# Patient Record
Sex: Male | Born: 1975 | Hispanic: Yes | Marital: Single | Smoking: Never smoker
Health system: Southern US, Community
[De-identification: ages and names within clinical notes are randomized; demographics above are authoritative.]

## PROBLEM LIST (undated history)

## (undated) DIAGNOSIS — Z789 Other specified health status: Secondary | ICD-10-CM

## (undated) HISTORY — DX: Other specified health status: Z78.9

## (undated) HISTORY — PX: NO PAST SURGERIES: SHX2092

---

## 2019-02-13 ENCOUNTER — Encounter (HOSPITAL_COMMUNITY): Payer: Self-pay | Admitting: Emergency Medicine

## 2019-02-13 ENCOUNTER — Inpatient Hospital Stay (HOSPITAL_COMMUNITY)
Admission: EM | Admit: 2019-02-13 | Discharge: 2019-02-19 | DRG: 177 | Disposition: A | Discharge status: Home / Self Care | Payer: HRSA Program | Attending: Internal Medicine | Admitting: Internal Medicine

## 2019-02-13 ENCOUNTER — Other Ambulatory Visit: Payer: Self-pay

## 2019-02-13 ENCOUNTER — Emergency Department (HOSPITAL_COMMUNITY): Payer: HRSA Program

## 2019-02-13 DIAGNOSIS — J1289 Other viral pneumonia: Secondary | ICD-10-CM | POA: Diagnosis present

## 2019-02-13 DIAGNOSIS — Z6832 Body mass index (BMI) 32.0-32.9, adult: Secondary | ICD-10-CM

## 2019-02-13 DIAGNOSIS — E871 Hypo-osmolality and hyponatremia: Secondary | ICD-10-CM | POA: Diagnosis present

## 2019-02-13 DIAGNOSIS — U071 COVID-19: Secondary | ICD-10-CM | POA: Diagnosis present

## 2019-02-13 DIAGNOSIS — E669 Obesity, unspecified: Secondary | ICD-10-CM | POA: Diagnosis present

## 2019-02-13 DIAGNOSIS — R001 Bradycardia, unspecified: Secondary | ICD-10-CM | POA: Diagnosis present

## 2019-02-13 DIAGNOSIS — E861 Hypovolemia: Secondary | ICD-10-CM | POA: Diagnosis present

## 2019-02-13 DIAGNOSIS — J9601 Acute respiratory failure with hypoxia: Secondary | ICD-10-CM | POA: Diagnosis present

## 2019-02-13 LAB — SARS CORONAVIRUS 2 BY RT PCR (HOSPITAL ORDER, PERFORMED IN ~~LOC~~ HOSPITAL LAB): SARS Coronavirus 2: POSITIVE — AB

## 2019-02-13 LAB — CBC WITH DIFFERENTIAL/PLATELET
Abs Immature Granulocytes: 0.03 10*3/uL (ref 0.00–0.07)
Basophils Absolute: 0 10*3/uL (ref 0.0–0.1)
Basophils Relative: 0 %
Eosinophils Absolute: 0.1 10*3/uL (ref 0.0–0.5)
Eosinophils Relative: 1 %
HCT: 40.8 % (ref 39.0–52.0)
Hemoglobin: 13.7 g/dL (ref 13.0–17.0)
Immature Granulocytes: 0 %
Lymphocytes Relative: 14 %
Lymphs Abs: 1.2 10*3/uL (ref 0.7–4.0)
MCH: 29.7 pg (ref 26.0–34.0)
MCHC: 33.6 g/dL (ref 30.0–36.0)
MCV: 88.3 fL (ref 80.0–100.0)
Monocytes Absolute: 0.4 10*3/uL (ref 0.1–1.0)
Monocytes Relative: 5 %
Neutro Abs: 6.9 10*3/uL (ref 1.7–7.7)
Neutrophils Relative %: 80 %
Platelets: 251 10*3/uL (ref 150–400)
RBC: 4.62 MIL/uL (ref 4.22–5.81)
RDW: 13.1 % (ref 11.5–15.5)
WBC: 8.6 10*3/uL (ref 4.0–10.5)
nRBC: 0 % (ref 0.0–0.2)

## 2019-02-13 LAB — COMPREHENSIVE METABOLIC PANEL
ALT: 27 U/L (ref 0–44)
AST: 34 U/L (ref 15–41)
Albumin: 3.4 g/dL — ABNORMAL LOW (ref 3.5–5.0)
Alkaline Phosphatase: 46 U/L (ref 38–126)
Anion gap: 15 (ref 5–15)
BUN: 15 mg/dL (ref 6–20)
CO2: 20 mmol/L — ABNORMAL LOW (ref 22–32)
Calcium: 8.5 mg/dL — ABNORMAL LOW (ref 8.9–10.3)
Chloride: 97 mmol/L — ABNORMAL LOW (ref 98–111)
Creatinine, Ser: 0.99 mg/dL (ref 0.61–1.24)
GFR calc Af Amer: >60 mL/min (ref >60)
GFR calc non Af Amer: >60 mL/min (ref >60)
Glucose, Bld: 107 mg/dL — ABNORMAL HIGH (ref 70–99)
Potassium: 3.6 mmol/L (ref 3.5–5.1)
Sodium: 132 mmol/L — ABNORMAL LOW (ref 135–145)
Total Bilirubin: 1.2 mg/dL (ref 0.3–1.2)
Total Protein: 8.1 g/dL (ref 6.5–8.1)

## 2019-02-13 LAB — LACTATE DEHYDROGENASE: LDH: 445 U/L — ABNORMAL HIGH (ref 98–192)

## 2019-02-13 LAB — TROPONIN I (HIGH SENSITIVITY): Troponin I (High Sensitivity): 3 ng/L (ref <18)

## 2019-02-13 LAB — PROCALCITONIN: Procalcitonin: 0.49 ng/mL

## 2019-02-13 LAB — TRIGLYCERIDES: Triglycerides: 123 mg/dL (ref <150)

## 2019-02-13 LAB — FIBRINOGEN: Fibrinogen: >800 mg/dL — ABNORMAL HIGH (ref 210–475)

## 2019-02-13 LAB — LACTIC ACID, PLASMA: Lactic Acid, Venous: 1.4 mmol/L (ref 0.5–1.9)

## 2019-02-13 LAB — FERRITIN: Ferritin: 694 ng/mL — ABNORMAL HIGH (ref 24–336)

## 2019-02-13 LAB — C-REACTIVE PROTEIN: CRP: 21.2 mg/dL — ABNORMAL HIGH (ref <1.0)

## 2019-02-13 LAB — D-DIMER, QUANTITATIVE: D-Dimer, Quant: 0.91 ug/mL-FEU — ABNORMAL HIGH (ref 0.00–0.50)

## 2019-02-13 MED ORDER — SODIUM CHLORIDE 0.9% FLUSH
3.0000 mL | Freq: Once | INTRAVENOUS | Status: AC
  Administered 2019-02-14: 3 mL via INTRAVENOUS

## 2019-02-13 MED ORDER — ACETAMINOPHEN 325 MG PO TABS
650.0000 mg | ORAL_TABLET | Freq: Once | ORAL | Status: AC | PRN
  Administered 2019-02-13: 650 mg via ORAL
  Filled 2019-02-13: qty 2

## 2019-02-13 NOTE — ED Provider Notes (Addendum)
[facility]-EMERGENCY DEPT Provider Note   CSN: [id number] Arrival date & time: 02/13/19  2035     History   Chief Complaint Chief Complaint  Patient presents with  . Fever  . Shortness of Breath  . Chest Pain    HPI Terrance Huynh is a 43 y.o. male.     HPI Patient reports has been sick for 13 days.  He has had fevers and cough.  He reports now he has been feeling short of breath and has chest pain with coughing.  He reports he is also intermittently had vomiting.  No swelling of the legs.  Reports he has felt achy.  Patient denies any other significant medical problems.  Patient reports that he drinks intermittently.  He denies a daily or regular alcohol use.  Patient denies any other medical problems. History reviewed. No pertinent past medical history.  There are no active problems to display for this patient.   History reviewed. No pertinent surgical history.      Home Medications    Prior to Admission medications   Not on File    Family History No family history on file.  Social History Social History   Tobacco Use  . Smoking status: Never Smoker  . Smokeless tobacco: Never Used  Substance Use Topics  . Alcohol use: Never    Frequency: Never  . Drug use: Never     Allergies   Patient has no allergy information on record.   Review of Systems Review of Systems 10 Systems reviewed and are negative for acute change except as noted in the HPI.   Physical Exam Updated Vital Signs BP 115/75   Pulse 80   Temp (!) 103 F (39.4 C) (Oral)   Resp (!) 38   SpO2 91%   Physical Exam Constitutional:      Comments: Patient is alert and appropriate.  Tachypnea but speaking in full sentences without difficulty.  Nontoxic.  HENT:     Mouth/Throat:     Mouth: Mucous membranes are moist.     Pharynx: Oropharynx is clear.  Eyes:     Extraocular Movements: Extraocular movements intact.     Conjunctiva/sclera:  Conjunctivae normal.  Cardiovascular:     Rate and Rhythm: Normal rate and regular rhythm.  Pulmonary:     Comments: Tachypnea without other respiratory distress.  Speaking in full sentences without difficulty.  Patient has expiratory wheeze at the bases. Abdominal:     General: There is no distension.     Palpations: Abdomen is soft.     Tenderness: There is no abdominal tenderness. There is no guarding.  Musculoskeletal: Normal range of motion.        General: No swelling or tenderness.     Right lower leg: No edema.     Left lower leg: No edema.  Skin:    General: Skin is warm and dry.  Neurological:     General: No focal deficit present.     Mental Status: He is oriented to person, place, and time.     Coordination: Coordination normal.  Psychiatric:        Mood and Affect: Mood normal.      ED Treatments / Results  Labs (all labs ordered are listed, but only abnormal results are displayed) Labs Reviewed  SARS CORONAVIRUS 2 (HOSPITAL ORDER, PERFORMED IN [facility]) - Abnormal; Notable for the following components:      Result Value   SARS Coronavirus  2 POSITIVE (*)    All other components within normal limits  COMPREHENSIVE METABOLIC PANEL - Abnormal; Notable for the following components:   Sodium 132 (*)    Chloride 97 (*)    CO2 20 (*)    Glucose, Bld 107 (*)    Calcium 8.5 (*)    Albumin 3.4 (*)    All other components within normal limits  D-DIMER, QUANTITATIVE (NOT AT [facility]) - Abnormal; Notable for the following components:   D-Dimer, Quant 0.91 (*)    All other components within normal limits  LACTATE DEHYDROGENASE - Abnormal; Notable for the following components:   LDH 445 (*)    All other components within normal limits  FERRITIN - Abnormal; Notable for the following components:   Ferritin 694 (*)    All other components within normal limits  FIBRINOGEN - Abnormal; Notable for the following components:   Fibrinogen >800 (*)    All other  components within normal limits  C-REACTIVE PROTEIN - Abnormal; Notable for the following components:   CRP 21.2 (*)    All other components within normal limits  CULTURE, BLOOD (ROUTINE X 2)  CULTURE, BLOOD (ROUTINE X 2)  LACTIC ACID, PLASMA  CBC WITH DIFFERENTIAL/PLATELET  PROCALCITONIN  TRIGLYCERIDES  LACTIC ACID, PLASMA  TROPONIN I (HIGH SENSITIVITY)  TROPONIN I (HIGH SENSITIVITY)    EKG EKG Interpretation  Date/Time:  Monday February 13 2019 20:50:46 EDT Ventricular Rate:  100 PR Interval:    QRS Duration: 87 QT Interval:  339 QTC Calculation: 438 R Axis:   15 Text Interpretation:  Sinus tachycardia a lot baseline artifact, no STEMI. no old comparison Confirmed by Arby BarrettePfeiffer, Dwayna Kentner [phone]([id number]) on 02/13/2019 9:15:17 PM   Radiology Dg Chest Port 1 View  Result Date: 02/13/2019 CLINICAL DATA:  Fevers and shortness of breath for 2 weeks EXAM: PORTABLE CHEST 1 VIEW COMPARISON:  None. FINDINGS: Cardiac shadow is prominent but accentuated by the portable technique. Patchy opacities are noted throughout both lungs which may represent mild congestive failure although multifocal pneumonia deserves consideration as well. No bony abnormality is seen. IMPRESSION: Multifocal opacities likely related to a multifocal pneumonia. Electronically Signed   By: Alcide CleverMark  Lukens M.D.   On: 02/13/2019 21:52    Procedures Procedures (including critical care time)  Medications Ordered in ED Medications  sodium chloride flush (NS) 0.9 % injection 3 mL (has no administration in time range)  acetaminophen (TYLENOL) tablet 650 mg (650 mg Oral Given 02/13/19 2104)     Initial Impression / Assessment and Plan / ED Course  I have reviewed the triage vital signs and the nursing notes.  Pertinent labs & imaging results that were available during my care of the patient were reviewed by me and considered in my medical decision making (see chart for details).         Consult: Dr. Toniann FailKakrakandy for admission   Patient presents with symptoms consistent with coronavirus.  He does test positive.  Patient has hypoxia with patchy chest infiltrates and elevated markers.  He is at day 13 of illness.  We will plan for admission for supportive care.  Terrance Huynh was evaluated in Emergency Department on 02/13/2019 for the symptoms described in the history of present illness. He was evaluated in the context of the global COVID-19 pandemic, which necessitated consideration that the patient might be at risk for infection with the SARS-CoV-2 virus that causes COVID-19. Institutional protocols and algorithms that pertain to the evaluation of patients at risk for  COVID-19 are in a state of rapid change based on information released by regulatory bodies including the CDC and federal and state organizations. These policies and algorithms were followed during the patient's care in the ED. Final Clinical Impressions(s) / ED Diagnoses   Final diagnoses:  COVID-19 virus infection    ED Discharge Orders    None       Charlesetta Shanks, MD 02/13/19 1683    Charlesetta Shanks, MD 02/13/19 2342

## 2019-02-13 NOTE — ED Triage Notes (Addendum)
Patient here from home with complaints of SOB, chest pain, and fever x2weeks. 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 Denies exposure to COVID, but "does not know". Patient does not speak [place].

## 2019-02-13 NOTE — ED Notes (Signed)
ED TO INPATIENT HANDOFF REPORT  Name/Age/Gender Terrance Huynh 43 y.o. male  Code Status   Home/SNF/Other Home  Chief Complaint fever; chest pain; shob  Level of Care/Admitting Diagnosis ED Disposition    ED Disposition Condition Comment   Admit  [facility] [[id number]]  Level of Care: Telemetry [5]  Covid Evaluation: Confirmed COVID Positive  Diagnosis: Pneumonia due to COVID-19 virus [[id number]][[id number]]  Admitting Physician: Eduard ClosKAKRAKANDY, ARSHAD N [phone][[id number]]  Attending Physician: Eduard ClosKAKRAKANDY, ARSHAD N [phone][[id number]]  Estimated length of stay: past midnight tomorrow  Certification:: I certify this patient will need inpatient services for at least 2 midnights  PT Class (Do Not Modify): Inpatient [101]  PT Acc Code (Do Not Modify): Private [1]       Medical History History reviewed. No pertinent past medical history.  Allergies No Known Allergies  IV Location/Drains/Wounds Patient Lines/Drains/Airways Status   Active Line/Drains/Airways    Name:   Placement date:   Placement time:   Site:   Days:   Peripheral IV 02/13/19 Left Forearm   02/13/19    2154    Forearm   less than 1          Labs/Imaging Results for orders placed or performed during the hospital encounter of 02/13/19 (from the past 48 hour(s))  Troponin I (High Sensitivity)     Status: None   Collection Time: 02/13/19  9:00 PM  Result Value Ref Range   Troponin I (High Sensitivity) 3 <18 ng/L    Comment: (NOTE) Elevated high sensitivity troponin I (hsTnI) values and significant  changes across serial measurements may suggest ACS but many other  chronic and acute conditions are known to elevate hsTnI results.  Refer to the "Links" section for chest pain algorithms and additional  guidance. Performed at [facility], [address] [place], [place] [address]   Lactic acid, plasma     Status: None   Collection Time: 02/13/19  9:22 PM  Result Value Ref  Range   Lactic Acid, Venous 1.4 0.5 - 1.9 mmol/L    Comment: Performed at [facility], [address] [place], [place] [address]  CBC WITH DIFFERENTIAL     Status: None   Collection Time: 02/13/19  9:22 PM  Result Value Ref Range   WBC 8.6 4.0 - 10.5 K/uL   RBC 4.62 4.22 - 5.81 MIL/uL   Hemoglobin 13.7 13.0 - 17.0 g/dL   HCT 86.540.8 78.439.0 - 69.652.0 %   MCV 88.3 80.0 - 100.0 fL   MCH 29.7 26.0 - 34.0 pg   MCHC 33.6 30.0 - 36.0 g/dL   RDW 29.513.1 28.411.5 - 13.215.5 %   Platelets 251 150 - 400 K/uL   nRBC 0.0 0.0 - 0.2 %   Neutrophils Relative % 80 %   Neutro Abs 6.9 1.7 - 7.7 K/uL   Lymphocytes Relative 14 %   Lymphs Abs 1.2 0.7 - 4.0 K/uL   Monocytes Relative 5 %   Monocytes Absolute 0.4 0.1 - 1.0 K/uL   Eosinophils Relative 1 %   Eosinophils Absolute 0.1 0.0 - 0.5 K/uL   Basophils Relative 0 %   Basophils Absolute 0.0 0.0 - 0.1 K/uL   Immature Granulocytes 0 %   Abs Immature Granulocytes 0.03 0.00 - 0.07 K/uL   Reactive, Benign Lymphocytes PRESENT     Comment: Performed at [facility], [address] [place], [place] [address]  Comprehensive metabolic panel  Status: Abnormal   Collection Time: 02/13/19  9:22 PM  Result Value Ref Range   Sodium 132 (L) 135 - 145 mmol/L   Potassium 3.6 3.5 - 5.1 mmol/L   Chloride 97 (L) 98 - 111 mmol/L   CO2 20 (L) 22 - 32 mmol/L   Glucose, Bld 107 (H) 70 - 99 mg/dL   BUN 15 6 - 20 mg/dL   Creatinine, Ser 1.610.99 0.61 - 1.24 mg/dL   Calcium 8.5 (L) 8.9 - 10.3 mg/dL   Total Protein 8.1 6.5 - 8.1 g/dL   Albumin 3.4 (L) 3.5 - 5.0 g/dL   AST 34 15 - 41 U/L   ALT 27 0 - 44 U/L   Alkaline Phosphatase 46 38 - 126 U/L   Total Bilirubin 1.2 0.3 - 1.2 mg/dL   GFR calc non Af Amer >60 >60 mL/min   GFR calc Af Amer >60 >60 mL/min   Anion gap 15 5 - 15    Comment: Performed at [facility], [address] [place], [place] [address]  D-dimer, quantitative     Status: Abnormal   Collection Time: 02/13/19   9:22 PM  Result Value Ref Range   D-Dimer, Quant 0.91 (H) 0.00 - 0.50 ug/mL-FEU    Comment: (NOTE) At the manufacturer cut-off of 0.50 ug/mL FEU, this assay has been documented to exclude PE with a sensitivity and negative predictive value of 97 to 99%.  At this time, this assay has not been approved by the FDA to exclude DVT/VTE. Results should be correlated with clinical presentation. Performed at [facility], [address] [place], [place] [address]   Procalcitonin     Status: None   Collection Time: 02/13/19  9:22 PM  Result Value Ref Range   Procalcitonin 0.49 ng/mL    Comment:        Interpretation: PCT (Procalcitonin) <= 0.5 ng/mL: Systemic infection (sepsis) is not likely. Local bacterial infection is possible. (NOTE)       Sepsis PCT Algorithm           Lower Respiratory Tract                                      Infection PCT Algorithm    ----------------------------     ----------------------------         PCT < 0.25 ng/mL                PCT < 0.10 ng/mL         Strongly encourage             Strongly discourage   discontinuation of antibiotics    initiation of antibiotics    ----------------------------     -----------------------------       PCT 0.25 - 0.50 ng/mL            PCT 0.10 - 0.25 ng/mL               OR       >80% decrease in PCT            Discourage initiation of                                            antibiotics  Encourage discontinuation           of antibiotics    ----------------------------     -----------------------------         PCT >= 0.50 ng/mL              PCT 0.26 - 0.50 ng/mL               AND        <80% decrease in PCT             Encourage initiation of                                             antibiotics       Encourage continuation           of antibiotics    ----------------------------     -----------------------------        PCT >= 0.50 ng/mL                  PCT > 0.50 ng/mL                AND         increase in PCT                  Strongly encourage                                      initiation of antibiotics    Strongly encourage escalation           of antibiotics                                     -----------------------------                                           PCT <= 0.25 ng/mL                                                 OR                                        > 80% decrease in PCT                                     Discontinue / Do not initiate                                             antibiotics Performed at [facility], [address], [place], [place] [address]   Lactate dehydrogenase     Status: Abnormal   Collection Time: 02/13/19  9:22 PM  Result Value  Ref Range   LDH 445 (H) 98 - 192 U/L    Comment: Performed at [facility], [place] [address] [place], [place] [address]  Ferritin     Status: Abnormal   Collection Time: 02/13/19  9:22 PM  Result Value Ref Range   Ferritin 694 (H) 24 - 336 ng/mL    Comment: Performed at [facility], [place] [address] [place], [place] [address]  Triglycerides     Status: None   Collection Time: 02/13/19  9:22 PM  Result Value Ref Range   Triglycerides 123 <150 mg/dL    Comment: Performed at [facility], [place] [address], [place], [place] [address]  Fibrinogen     Status: Abnormal   Collection Time: 02/13/19  9:22 PM  Result Value Ref Range   Fibrinogen >800 (H) 210 - 475 mg/dL    Comment: REPEATED TO VERIFY Performed at [facility], [place] [address], [place], [place] [address]   C-reactive protein     Status: Abnormal   Collection Time: 02/13/19  9:22 PM  Result Value Ref Range   CRP 21.2 (H) <1.0 mg/dL    Comment: Performed at [facility], [place] [address] [place], [place] [address]  SARS Coronavirus 2 [facility] order, Performed in [facility] hospital lab) Nasopharyngeal Nasopharyngeal  Swab     Status: Abnormal   Collection Time: 02/13/19  9:23 PM   Specimen: Nasopharyngeal Swab  Result Value Ref Range   SARS Coronavirus 2 POSITIVE (A) NEGATIVE    Comment: RESULT CALLED TO, READ BACK BY AND VERIFIED WITH: DOSTER,T @ [id number] ON [id number] BY POTEAT,S (NOTE) If result is NEGATIVE SARS-CoV-2 target nucleic acids are NOT DETECTED. The SARS-CoV-2 RNA is generally detectable in upper and lower  respiratory specimens during the acute phase of infection. The lowest  concentration of SARS-CoV-2 viral copies this assay can detect is 250  copies / mL. A negative result does not preclude SARS-CoV-2 infection  and should not be used as the sole basis for treatment or other  patient management decisions.  A negative result may occur with  improper specimen collection / handling, submission of specimen other  than nasopharyngeal swab, presence of viral mutation(s) within the  areas targeted by this assay, and inadequate number of viral copies  (<250 copies / mL). A negative result must be combined with clinical  observations, patient history, and epidemiological information. If result is POSITIVE SARS-CoV-2 target nucleic acids are DETECTED.  The SARS-CoV-2 RNA is generally detectable in upper and lower  respiratory specimens during the acute phase of infection.  Positive  results are indicative of active infection with SARS-CoV-2.  Clinical  correlation with patient history and other diagnostic information is  necessary to determine patient infection status.  Positive results do  not rule out bacterial infection or co-infection with other viruses. If result is PRESUMPTIVE POSTIVE SARS-CoV-2 nucleic acids MAY BE PRESENT.   A presumptive positive result was obtained on the submitted specimen  and confirmed on repeat testing.  While 2019 novel coronavirus  (SARS-CoV-2) nucleic acids may be present in the submitted sample  additional confirmatory testing may be necessary for  epidemiological  and / or clinical management purposes  to differentiate between  SARS-CoV-2 and other Sarbecovirus currently known to infect humans.  If clinically indicated additional testing with an alternate test  methodology [phone])  is advised. The SARS-CoV-2 RNA is generally  detectable in upper and lower respiratory specimens during the  acute  phase of infection. The expected result is Negative. Fact Sheet for Patients:  BoilerBrush.com.cyhttps://www.fda.gov/media/136312/download Fact Sheet for Healthcare Providers: https://pope.com/https://www.fda.gov/media/136313/download This test is not yet approved or cleared by the [place] FDA and has been authorized for detection and/or diagnosis of SARS-CoV-2 by FDA under an Emergency Use Authorization (EUA).  This EUA will remain in effect (meaning this test can be used) for the duration of the COVID-19 declaration under Section 564(b)(1) of the Act, 21 U.S.C. section 360bbb-3(b)(1), unless the authorization is terminated or revoked sooner. Performed at [facility], [address] [place], [place] [address]    Dg Chest Port 1 View  Result Date: 02/13/2019 CLINICAL DATA:  Fevers and shortness of breath for 2 weeks EXAM: PORTABLE CHEST 1 VIEW COMPARISON:  None. FINDINGS: Cardiac shadow is prominent but accentuated by the portable technique. Patchy opacities are noted throughout both lungs which may represent mild congestive failure although multifocal pneumonia deserves consideration as well. No bony abnormality is seen. IMPRESSION: Multifocal opacities likely related to a multifocal pneumonia. Electronically Signed   By: Alcide CleverMark  Lukens M.D.   On: 02/13/2019 21:52    Pending Labs Unresulted Labs (From admission, onward)    Start     Ordered   02/13/19 2122  Lactic acid, plasma  Now then every 2 hours,   STAT     02/13/19 2123   02/13/19 2122  Blood Culture (routine x 2)  BLOOD CULTURE X 2,   STAT     02/13/19 2123           Vitals/Pain Today's Vitals   02/13/19 2205 02/13/19 2300 02/13/19 2336 02/13/19 2343  BP: 121/77 115/75    Pulse: 86 80    Resp: (!) 33 (!) 38    Temp:    99.2 F (37.3 C)  TempSrc:    Oral  SpO2: 93% 91%    PainSc:   5      Isolation Precautions Airborne and Contact precautions  Medications Medications  sodium chloride flush (NS) 0.9 % injection 3 mL (has no administration in time range)  acetaminophen (TYLENOL) tablet 650 mg (650 mg Oral Given 02/13/19 2104)    Mobility walks

## 2019-02-14 ENCOUNTER — Encounter (HOSPITAL_COMMUNITY): Payer: Self-pay | Admitting: Internal Medicine

## 2019-02-14 DIAGNOSIS — J9601 Acute respiratory failure with hypoxia: Secondary | ICD-10-CM | POA: Diagnosis present

## 2019-02-14 LAB — CBC WITH DIFFERENTIAL/PLATELET
Abs Immature Granulocytes: 0.04 10*3/uL (ref 0.00–0.07)
Basophils Absolute: 0 10*3/uL (ref 0.0–0.1)
Basophils Relative: 0 %
Eosinophils Absolute: 0.1 10*3/uL (ref 0.0–0.5)
Eosinophils Relative: 1 %
HCT: 39.5 % (ref 39.0–52.0)
Hemoglobin: 13.1 g/dL (ref 13.0–17.0)
Immature Granulocytes: 1 %
Lymphocytes Relative: 15 %
Lymphs Abs: 1.3 10*3/uL (ref 0.7–4.0)
MCH: 29.7 pg (ref 26.0–34.0)
MCHC: 33.2 g/dL (ref 30.0–36.0)
MCV: 89.6 fL (ref 80.0–100.0)
Monocytes Absolute: 0.3 10*3/uL (ref 0.1–1.0)
Monocytes Relative: 4 %
Neutro Abs: 6.6 10*3/uL (ref 1.7–7.7)
Neutrophils Relative %: 79 %
Platelets: 267 10*3/uL (ref 150–400)
RBC: 4.41 MIL/uL (ref 4.22–5.81)
RDW: 12.9 % (ref 11.5–15.5)
WBC: 8.4 10*3/uL (ref 4.0–10.5)
nRBC: 0 % (ref 0.0–0.2)

## 2019-02-14 LAB — PROCALCITONIN: Procalcitonin: 0.67 ng/mL

## 2019-02-14 LAB — TROPONIN I (HIGH SENSITIVITY): Troponin I (High Sensitivity): 2 ng/L (ref <18)

## 2019-02-14 LAB — CREATININE, SERUM
Creatinine, Ser: 0.94 mg/dL (ref 0.61–1.24)
GFR calc Af Amer: >60 mL/min (ref >60)
GFR calc non Af Amer: >60 mL/min (ref >60)

## 2019-02-14 LAB — LACTATE DEHYDROGENASE: LDH: 447 U/L — ABNORMAL HIGH (ref 98–192)

## 2019-02-14 LAB — C-REACTIVE PROTEIN: CRP: 22.5 mg/dL — ABNORMAL HIGH (ref <1.0)

## 2019-02-14 LAB — ABO/RH: ABO/RH(D): O POS

## 2019-02-14 LAB — HIV ANTIBODY (ROUTINE TESTING W REFLEX): HIV Screen 4th Generation wRfx: NONREACTIVE

## 2019-02-14 LAB — FERRITIN: Ferritin: 655 ng/mL — ABNORMAL HIGH (ref 24–336)

## 2019-02-14 LAB — LACTIC ACID, PLASMA: Lactic Acid, Venous: 1.2 mmol/L (ref 0.5–1.9)

## 2019-02-14 MED ORDER — DEXAMETHASONE 6 MG PO TABS
6.0000 mg | ORAL_TABLET | Freq: Every day | ORAL | Status: DC
  Administered 2019-02-14: 6 mg via ORAL
  Filled 2019-02-14 (×2): qty 1

## 2019-02-14 MED ORDER — SODIUM CHLORIDE 0.9 % IV SOLN
200.0000 mg | Freq: Once | INTRAVENOUS | Status: AC
  Administered 2019-02-14: 200 mg via INTRAVENOUS
  Filled 2019-02-14: qty 40

## 2019-02-14 MED ORDER — ONDANSETRON HCL 4 MG/2ML IJ SOLN: 4.0000 mg | Freq: Four times a day (QID) | INTRAMUSCULAR | Status: DC | PRN

## 2019-02-14 MED ORDER — ACETAMINOPHEN 325 MG PO TABS
650.0000 mg | ORAL_TABLET | Freq: Four times a day (QID) | ORAL | Status: DC | PRN
  Administered 2019-02-14 – 2019-02-15 (×3): 650 mg via ORAL
  Filled 2019-02-14 (×4): qty 2

## 2019-02-14 MED ORDER — TOCILIZUMAB 400 MG/20ML IV SOLN
600.0000 mg | Freq: Once | INTRAVENOUS | Status: AC
  Administered 2019-02-14: 600 mg via INTRAVENOUS
  Filled 2019-02-14: qty 10

## 2019-02-14 MED ORDER — ENOXAPARIN SODIUM 40 MG/0.4ML ~~LOC~~ SOLN
40.0000 mg | SUBCUTANEOUS | Status: DC
  Administered 2019-02-14 – 2019-02-19 (×6): 40 mg via SUBCUTANEOUS
  Filled 2019-02-14 (×6): qty 0.4

## 2019-02-14 MED ORDER — ORAL CARE MOUTH RINSE
15.0000 mL | Freq: Two times a day (BID) | OROMUCOSAL | Status: DC
  Administered 2019-02-14 – 2019-02-19 (×9): 15 mL via OROMUCOSAL

## 2019-02-14 MED ORDER — SODIUM CHLORIDE 0.9 % IV SOLN
100.0000 mg | INTRAVENOUS | Status: AC
  Administered 2019-02-15 – 2019-02-18 (×4): 100 mg via INTRAVENOUS
  Filled 2019-02-14 (×4): qty 20

## 2019-02-14 MED ORDER — TOCILIZUMAB 400 MG/20ML IV SOLN
8.0000 mg/kg | Freq: Once | INTRAVENOUS | Status: DC
  Filled 2019-02-14: qty 32.8

## 2019-02-14 MED ORDER — GUAIFENESIN-DM 100-10 MG/5ML PO SYRP
10.0000 mL | ORAL_SOLUTION | ORAL | Status: DC | PRN
  Administered 2019-02-14 – 2019-02-18 (×8): 10 mL via ORAL
  Filled 2019-02-14 (×9): qty 10

## 2019-02-14 MED ORDER — ONDANSETRON HCL 4 MG PO TABS
4.0000 mg | ORAL_TABLET | Freq: Four times a day (QID) | ORAL | Status: DC | PRN
  Administered 2019-02-14: 4 mg via ORAL
  Filled 2019-02-14: qty 1

## 2019-02-14 MED ORDER — ACETAMINOPHEN 650 MG RE SUPP: 650.0000 mg | Freq: Four times a day (QID) | RECTAL | Status: DC | PRN

## 2019-02-14 MED ORDER — METHYLPREDNISOLONE SODIUM SUCC 40 MG IJ SOLR
40.0000 mg | Freq: Two times a day (BID) | INTRAMUSCULAR | Status: DC
  Administered 2019-02-14: 40 mg via INTRAVENOUS
  Filled 2019-02-14: qty 1

## 2019-02-14 NOTE — Plan of Care (Signed)
Pt is progressing 

## 2019-02-14 NOTE — ED Notes (Signed)
[facility] at bedside 

## 2019-02-14 NOTE — Progress Notes (Signed)
[facility] PROGRESS NOTE    Progress Note  Terrance Huynh  AOZ:[id number] DOB: [birth year] DOA: 02/13/2019 PCP: Patient, No Pcp Per     Brief Narrative:   Terrance Huynh is an 43 y.o. male no past medical history comes into the ED complaining of shortness of breath malaise for 2 weeks, he describes subjective fevers some nausea denies vomiting or diarrhea.  In the ER was found with a temperature of 100.3, chest x-ray showed bilateral infiltrates requiring 2 L of oxygen to keep saturation above 94%.  Bicarb is 20 anion gap is 15 LDH 45 ferritin of 694, procalcitonin 1.4, CRP are 21 platelet 251.  Symptoms started as "per patient" 7.20.2020  Assessment/Plan:   Principal Problem:   Acute respiratory failure with hypoxia (HCC) Active Problems:   COVID-19 virus infection Acute respiratory failure with hypoxia likely due to COVID-19 viral infection: He was started on IV Solu-Medrol and IV Remdesivir. Change steroids to decadron. Currently requiring 2 L of oxygen to keep saturations above 92%.  Inflammatory markers are significantly elevated, his CRP is 21.2, no leukocytosis or leukopenia. procalcitonin 0.4. The treatment plan and use of medications (Actemra) and known side effects were discussed with patient/family, they were clearly explained that there is no proven definitive treatment for COVID-19 infection, any medications used here are based on published clinical articles/anecdotal data which are not peer-reviewed or randomized control trials.  Complete risks and long-term side effects are unknown, however in the best clinical judgment they seem to be of some clinical benefit rather than medical risks.  Patient/family agree with the treatment plan and want to receive the given medications. He will like to received if needed.  Mild hypovolemic hyponatremia: Along with a low chloride, with decrease oral intake started on IV fluids, recheck basic metabolic panel in  the morning.  DVT prophylaxis: lovenox Family Communication:none Disposition Plan/Barrier to D/C: once of oxygen Code Status:     Code Status Orders  (From admission, onward)         Start     Ordered   02/14/19 0256  Full code  Continuous     02/14/19 0255        Code Status History    This patient has a current code status but no historical code status.   Advance Care Planning Activity        IV Access:    Peripheral IV   Procedures and diagnostic studies:   Dg Chest Port 1 View  Result Date: 02/13/2019 CLINICAL DATA:  Fevers and shortness of breath for 2 weeks EXAM: PORTABLE CHEST 1 VIEW COMPARISON:  None. FINDINGS: Cardiac shadow is prominent but accentuated by the portable technique. Patchy opacities are noted throughout both lungs which may represent mild congestive failure although multifocal pneumonia deserves consideration as well. No bony abnormality is seen. IMPRESSION: Multifocal opacities likely related to a multifocal pneumonia. Electronically Signed   By: Alcide CleverMark  Lukens M.D.   On: 02/13/2019 21:52     Medical Consultants:    None.  Anti-Infectives:     Subjective:    Terrance Huynh he relates his breathing is stable compared to yesterday.  Objective:    Vitals:   02/13/19 2343 02/14/19 0000 02/14/19 0241 02/14/19 0242  BP:  (!) 104/59 109/76 109/76  Pulse:  77  77  Resp:  (!) 38  (!) 38  Temp: 99.2 F (37.3 C)  99.2 F (37.3 C) 99.2 F (37.3 C)  TempSrc: Oral  Oral  SpO2:  94%    Weight:    82.1 kg  Height:    5' 2.99" (1.6 m)   SpO2: 94 % O2 Flow Rate (L/min): 3 L/min   Intake/Output Summary (Last 24 hours) at 02/14/2019 0700 Last data filed at 02/14/2019 0345 Gross per 24 hour  Intake 544.92 ml  Output -  Net 544.92 ml   Filed Weights   02/14/19 0242  Weight: 82.1 kg    Exam: .General exam: In no acute distress. Respiratory system: Good air movement and crackles at bases. Cardiovascular system: S1 & S2  heard, RRR. No JVD. Gastrointestinal system: Abdomen is nondistended, soft and nontender.  Central nervous system: Alert and oriented. No focal neurological deficits. Extremities: No pedal edema. Skin: No rashes, lesions or ulcers Psychiatry: Judgement and insight appear normal. Mood & affect appropriate.   Data Reviewed:    Labs: Basic Metabolic Panel: Recent Labs  Lab 02/13/19 2122  NA 132*  K 3.6  CL 97*  CO2 20*  GLUCOSE 107*  BUN 15  CREATININE 0.99  CALCIUM 8.5*   GFR Estimated Creatinine Clearance: 92.1 mL/min (by C-G formula based on SCr of 0.99 mg/dL). Liver Function Tests: Recent Labs  Lab 02/13/19 2122  AST 34  ALT 27  ALKPHOS 46  BILITOT 1.2  PROT 8.1  ALBUMIN 3.4*   No results for input(s): LIPASE, AMYLASE in the last 168 hours. No results for input(s): AMMONIA in the last 168 hours. Coagulation profile No results for input(s): INR, PROTIME in the last 168 hours. COVID-19 Labs  Recent Labs    02/13/19 2122  DDIMER 0.91*  FERRITIN 694*  LDH 445*  CRP 21.2*    Lab Results  Component Value Date   SARSCOV2NAA POSITIVE (A) 02/13/2019    CBC: Recent Labs  Lab 02/13/19 2122 02/14/19 0425  WBC 8.6 8.4  NEUTROABS 6.9 6.6  HGB 13.7 13.1  HCT 40.8 39.5  MCV 88.3 89.6  PLT 251 267   Cardiac Enzymes: No results for input(s): CKTOTAL, CKMB, CKMBINDEX, TROPONINI in the last 168 hours. BNP (last 3 results) No results for input(s): PROBNP in the last 8760 hours. CBG: No results for input(s): GLUCAP in the last 168 hours. D-Dimer: Recent Labs    02/13/19 2122  DDIMER 0.91*   Hgb A1c: No results for input(s): HGBA1C in the last 72 hours. Lipid Profile: Recent Labs    02/13/19 2122  TRIG 123   Thyroid function studies: No results for input(s): TSH, T4TOTAL, T3FREE, THYROIDAB in the last 72 hours.  Invalid input(s): FREET3 Anemia work up: Recent Labs    02/13/19 2122  FERRITIN 694*   Sepsis Labs: Recent Labs  Lab 02/13/19  2122 02/13/19 2337 02/14/19 0425  PROCALCITON 0.49  --   --   WBC 8.6  --  8.4  LATICACIDVEN 1.4 1.2  --    Microbiology Recent Results (from the past 240 hour(s))  SARS Coronavirus 2 [facility] order, Performed in [facility] hospital lab) Nasopharyngeal Nasopharyngeal Swab     Status: Abnormal   Collection Time: 02/13/19  9:23 PM   Specimen: Nasopharyngeal Swab  Result Value Ref Range Status   SARS Coronavirus 2 POSITIVE (A) NEGATIVE Final    Comment: RESULT CALLED TO, READ BACK BY AND VERIFIED WITH: DOSTER,T @ 1610 ON [id number] BY POTEAT,S (NOTE) If result is NEGATIVE SARS-CoV-2 target nucleic acids are NOT DETECTED. The SARS-CoV-2 RNA is generally detectable in upper and lower  respiratory specimens during the acute phase of  infection. The lowest  concentration of SARS-CoV-2 viral copies this assay can detect is 250  copies / mL. A negative result does not preclude SARS-CoV-2 infection  and should not be used as the sole basis for treatment or other  patient management decisions.  A negative result may occur with  improper specimen collection / handling, submission of specimen other  than nasopharyngeal swab, presence of viral mutation(s) within the  areas targeted by this assay, and inadequate number of viral copies  (<250 copies / mL). A negative result must be combined with clinical  observations, patient history, and epidemiological information. If result is POSITIVE SARS-CoV-2 target nucleic acids are DETECTED.  The SARS-CoV-2 RNA is generally detectable in upper and lower  respiratory specimens during the acute phase of infection.  Positive  results are indicative of active infection with SARS-CoV-2.  Clinical  correlation with patient history and other diagnostic information is  necessary to determine patient infection status.  Positive results do  not rule out bacterial infection or co-infection with other viruses. If result is PRESUMPTIVE POSTIVE SARS-CoV-2 nucleic  acids MAY BE PRESENT.   A presumptive positive result was obtained on the submitted specimen  and confirmed on repeat testing.  While 2019 novel coronavirus  (SARS-CoV-2) nucleic acids may be present in the submitted sample  additional confirmatory testing may be necessary for epidemiological  and / or clinical management purposes  to differentiate between  SARS-CoV-2 and other Sarbecovirus currently known to infect humans.  If clinically indicated additional testing with an alternate test  methodology [phone]([id number])  is advised. The SARS-CoV-2 RNA is generally  detectable in upper and lower respiratory specimens during the acute  phase of infection. The expected result is Negative. Fact Sheet for Patients:  BoilerBrush.com.cyhttps://www.fda.gov/media/136312/download Fact Sheet for Healthcare Providers: https://pope.com/https://www.fda.gov/media/136313/download This test is not yet approved or cleared by the [place] FDA and has been authorized for detection and/or diagnosis of SARS-CoV-2 by FDA under an Emergency Use Authorization (EUA).  This EUA will remain in effect (meaning this test can be used) for the duration of the COVID-19 declaration under Section 564(b)(1) of the Act, 21 U.S.C. section 360bbb-3(b)(1), unless the authorization is terminated or revoked sooner. Performed at [facility], [address] [place], [place] [address]      Medications:   . enoxaparin (LOVENOX) injection  40 mg Subcutaneous Q24H  . mouth rinse  15 mL Mouth Rinse BID  . methylPREDNISolone (SOLU-MEDROL) injection  40 mg Intravenous BID   Continuous Infusions: . [START ON 02/15/2019] remdesivir 100 mg in NS 250 mL       LOS: 1 day   Marinda Elkbraham Feliz Ortiz  [facility]  02/14/2019, 7:00 AM

## 2019-02-14 NOTE — ED Notes (Signed)
Admitting MD at bedside.

## 2019-02-14 NOTE — H&P (Signed)
History and Physical    Terrance Huynh JXB:[id number] DOB: 20-Jun-1976 DOA: 02/13/2019  PCP: Patient, No Pcp Per  Patient coming from: Home.  Spanish translation used.  History also obtained from patient's niece.  Chief Complaint: Shortness of breath.  HPI: Terrance Huynh is a 43 y.o. male with no significant past medical history has been having shortness of breath with body aches over the last 2 weeks.  Subjective feeling of fever chills.  Denies any cough or productive of sputum.  Has some nausea.  Denies vomiting or diarrhea.  Given the ongoing symptoms patient presents to the ER.  Patient's pain is mostly in the chest and abdominal area.  ED Course: In the ER patient was febrile with temperature of 103 F chest x-ray showing multifocal pneumonia patient was hypoxic with requiring 2 L oxygen.  COVID-19 test was positive.  EKG shows normal sinus rhythm.  Sodium was 132 bicarb 20 anion gap 15 LDH 445 ferritin 694 CRP 21.2 procalcitonin 1.4 hemoglobin 13.7 platelets 251.  Patient admitted with acute respiratory failure with hypoxia secondary to COVID pneumonia.  Patient has mild diffuse tenderness of the abdomen with no rigidity or guarding rebound tenderness.  Review of Systems: As per HPI, rest all negative.   History reviewed. No pertinent past medical history.  History reviewed. No pertinent surgical history.   reports that he has never smoked. He has never used smokeless tobacco. He reports that he does not drink alcohol or use drugs.  No Known Allergies  Family History  Problem Relation Age of Onset  . Diabetes Mellitus II Neg Hx     Prior to Admission medications   Not on File    Physical Exam: Constitutional: Moderately built and nourished. Vitals:   02/13/19 2205 02/13/19 2300 02/13/19 2343 02/14/19 0000  BP: 121/77 115/75  (!) 104/59  Pulse: 86 80  77  Resp: (!) 33 (!) 38  (!) 38  Temp:   99.2 F (37.3 C)   TempSrc:   Oral   SpO2: 93% 91%   94%   Eyes: Anicteric no pallor. ENMT: No discharge from the ears eyes nose or mouth. Neck: No mass felt.  No neck rigidity. Respiratory: No rhonchi or crepitations. Cardiovascular: S1-S2 heard. Abdomen: Soft nontender bowel sounds present. Musculoskeletal: No edema. Skin: No rash. Neurologic: Alert awake oriented to time place and person.  Moves all extremities. Psychiatric: Appears normal per normal affect.   Labs on Admission: I have personally reviewed following labs and imaging studies  CBC: Recent Labs  Lab 02/13/19 2122  WBC 8.6  NEUTROABS 6.9  HGB 13.7  HCT 40.8  MCV 88.3  PLT 130   Basic Metabolic Panel: Recent Labs  Lab 02/13/19 2122  NA 132*  K 3.6  CL 97*  CO2 20*  GLUCOSE 107*  BUN 15  CREATININE 0.99  CALCIUM 8.5*   GFR: CrCl cannot be calculated (Unknown ideal weight.). Liver Function Tests: Recent Labs  Lab 02/13/19 2122  AST 34  ALT 27  ALKPHOS 46  BILITOT 1.2  PROT 8.1  ALBUMIN 3.4*   No results for input(s): LIPASE, AMYLASE in the last 168 hours. No results for input(s): AMMONIA in the last 168 hours. Coagulation Profile: No results for input(s): INR, PROTIME in the last 168 hours. Cardiac Enzymes: No results for input(s): CKTOTAL, CKMB, CKMBINDEX, TROPONINI in the last 168 hours. BNP (last 3 results) No results for input(s): PROBNP in the last 8760 hours. HbA1C: No results for input(s):  HGBA1C in the last 72 hours. CBG: No results for input(s): GLUCAP in the last 168 hours. Lipid Profile: Recent Labs    02/13/19 2122  TRIG 123   Thyroid Function Tests: No results for input(s): TSH, T4TOTAL, FREET4, T3FREE, THYROIDAB in the last 72 hours. Anemia Panel: Recent Labs    02/13/19 2122  FERRITIN 694*   Urine analysis: No results found for: COLORURINE, APPEARANCEUR, LABSPEC, PHURINE, GLUCOSEU, HGBUR, BILIRUBINUR, KETONESUR, PROTEINUR, UROBILINOGEN, NITRITE, LEUKOCYTESUR Sepsis Labs:  @LABRCNTIP (procalcitonin:4,lacticidven:4) ) Recent Results (from the past 240 hour(s))  SARS Coronavirus 2 [facility](Hospital order, Performed in [facility] hospital lab) Nasopharyngeal Nasopharyngeal Swab     Status: Abnormal   Collection Time: 02/13/19  9:23 PM   Specimen: Nasopharyngeal Swab  Result Value Ref Range Status   SARS Coronavirus 2 POSITIVE (A) NEGATIVE Final    Comment: RESULT CALLED TO, READ BACK BY AND VERIFIED WITH: DOSTER,T @ 2319 ON [id number] BY POTEAT,S (NOTE) If result is NEGATIVE SARS-CoV-2 target nucleic acids are NOT DETECTED. The SARS-CoV-2 RNA is generally detectable in upper and lower  respiratory specimens during the acute phase of infection. The lowest  concentration of SARS-CoV-2 viral copies this assay can detect is 250  copies / mL. A negative result does not preclude SARS-CoV-2 infection  and should not be used as the sole basis for treatment or other  patient management decisions.  A negative result may occur with  improper specimen collection / handling, submission of specimen other  than nasopharyngeal swab, presence of viral mutation(s) within the  areas targeted by this assay, and inadequate number of viral copies  (<250 copies / mL). A negative result must be combined with clinical  observations, patient history, and epidemiological information. If result is POSITIVE SARS-CoV-2 target nucleic acids are DETECTED.  The SARS-CoV-2 RNA is generally detectable in upper and lower  respiratory specimens during the acute phase of infection.  Positive  results are indicative of active infection with SARS-CoV-2.  Clinical  correlation with patient history and other diagnostic information is  necessary to determine patient infection status.  Positive results do  not rule out bacterial infection or co-infection with other viruses. If result is PRESUMPTIVE POSTIVE SARS-CoV-2 nucleic acids MAY BE PRESENT.   A presumptive positive result was obtained on the submitted  specimen  and confirmed on repeat testing.  While 2019 novel coronavirus  (SARS-CoV-2) nucleic acids may be present in the submitted sample  additional confirmatory testing may be necessary for epidemiological  and / or clinical management purposes  to differentiate between  SARS-CoV-2 and other Sarbecovirus currently known to infect humans.  If clinically indicated additional testing with an alternate test  methodology [phone])  is advised. The SARS-CoV-2 RNA is generally  detectable in upper and lower respiratory specimens during the acute  phase of infection. The expected result is Negative. Fact Sheet for Patients:  BoilerBrush.com.cyhttps://www.fda.gov/media/136312/download Fact Sheet for Healthcare Providers: https://pope.com/https://www.fda.gov/media/136313/download This test is not yet approved or cleared by the [place] FDA and has been authorized for detection and/or diagnosis of SARS-CoV-2 by FDA under an Emergency Use Authorization (EUA).  This EUA will remain in effect (meaning this test can be used) for the duration of the COVID-19 declaration under Section 564(b)(1) of the Act, 21 U.S.C. section 360bbb-3(b)(1), unless the authorization is terminated or revoked sooner. Performed at [facility], [address] [place], [place] [address]      Radiological Exams on Admission: Dg Chest Port 1 View  Result Date: 02/13/2019 CLINICAL  DATA:  Fevers and shortness of breath for 2 weeks EXAM: PORTABLE CHEST 1 VIEW COMPARISON:  None. FINDINGS: Cardiac shadow is prominent but accentuated by the portable technique. Patchy opacities are noted throughout both lungs which may represent mild congestive failure although multifocal pneumonia deserves consideration as well. No bony abnormality is seen. IMPRESSION: Multifocal opacities likely related to a multifocal pneumonia. Electronically Signed   By: Alcide CleverMark  Lukens M.D.   On: 02/13/2019 21:52    EKG: Independently reviewed.  Normal sinus rhythm.   Assessment/Plan Principal Problem:   Acute respiratory failure with hypoxia (HCC) Active Problems:   COVID-19 virus infection    1. Acute respiratory failure with hypoxia secondary to COVID pneumonia -I have ordered Solu-Medrol 40 mg IV every 12 and also requested pharmacy for Remdesivir.  Will closely follow CRP and other inflammatory markers. 2. Mild hyponatremia likely from dehydration.  Closely observe. 3. Chest and abdominal discomfort likely from COVID pneumonia.  We will continue to observe.  Given that patient requires close observation for respiratory failure with multifocal pneumonia secondary COVID pneumonia patient will need inpatient status.   DVT prophylaxis: Lovenox. Code Status: Full code. Family Communication: Patient's niece. Disposition Plan: Home. Consults called: None. Admission status: Inpatient.   Eduard ClosArshad N Athalene Kolle MD [facility] Pager [phone].  If 7PM-7AM, please contact night-coverage [email or web address] Password [email or web address]  02/14/2019, 2:18 AM

## 2019-02-14 NOTE — Progress Notes (Signed)
MEDICATION RELATED CONSULT NOTE - INITIAL   Pharmacy Consult for Remdesivir Indication: COVID-19  Assessment: 43 yo M presents with SOB. Found to be positive with COVID-19. CXR shows PNA and on [place]. ALT wnl.  Plan:  Give remdesivir 200mg  IV x 1, then start remdesivir 100mg  IV x 4 days Monitor clinical progress and ALT  Elenor Quinones, PharmD, BCPS, BCIDP Clinical Pharmacist 02/14/2019 2:38 AM

## 2019-02-14 NOTE — ED Notes (Signed)
[facility] contacted and paperwork printed  

## 2019-02-15 DIAGNOSIS — J9601 Acute respiratory failure with hypoxia: Secondary | ICD-10-CM

## 2019-02-15 DIAGNOSIS — E871 Hypo-osmolality and hyponatremia: Secondary | ICD-10-CM

## 2019-02-15 DIAGNOSIS — U071 COVID-19: Principal | ICD-10-CM

## 2019-02-15 LAB — CBC
HCT: 39.5 % (ref 39.0–52.0)
Hemoglobin: 12.9 g/dL — ABNORMAL LOW (ref 13.0–17.0)
MCH: 29.3 pg (ref 26.0–34.0)
MCHC: 32.7 g/dL (ref 30.0–36.0)
MCV: 89.8 fL (ref 80.0–100.0)
Platelets: 338 10*3/uL (ref 150–400)
RBC: 4.4 MIL/uL (ref 4.22–5.81)
RDW: 12.9 % (ref 11.5–15.5)
WBC: 11 10*3/uL — ABNORMAL HIGH (ref 4.0–10.5)
nRBC: 0 % (ref 0.0–0.2)

## 2019-02-15 LAB — FERRITIN: Ferritin: 1061 ng/mL — ABNORMAL HIGH (ref 24–336)

## 2019-02-15 LAB — COMPREHENSIVE METABOLIC PANEL
ALT: 28 U/L (ref 0–44)
AST: 26 U/L (ref 15–41)
Albumin: 3.1 g/dL — ABNORMAL LOW (ref 3.5–5.0)
Alkaline Phosphatase: 46 U/L (ref 38–126)
Anion gap: 8 (ref 5–15)
BUN: 20 mg/dL (ref 6–20)
CO2: 25 mmol/L (ref 22–32)
Calcium: 9 mg/dL (ref 8.9–10.3)
Chloride: 104 mmol/L (ref 98–111)
Creatinine, Ser: 0.77 mg/dL (ref 0.61–1.24)
GFR calc Af Amer: >60 mL/min (ref >60)
GFR calc non Af Amer: >60 mL/min (ref >60)
Glucose, Bld: 140 mg/dL — ABNORMAL HIGH (ref 70–99)
Potassium: 4 mmol/L (ref 3.5–5.1)
Sodium: 137 mmol/L (ref 135–145)
Total Bilirubin: 0.5 mg/dL (ref 0.3–1.2)
Total Protein: 7.6 g/dL (ref 6.5–8.1)

## 2019-02-15 LAB — C-REACTIVE PROTEIN: CRP: 23.7 mg/dL — ABNORMAL HIGH (ref <1.0)

## 2019-02-15 LAB — D-DIMER, QUANTITATIVE: D-Dimer, Quant: 0.52 ug/mL-FEU — ABNORMAL HIGH (ref 0.00–0.50)

## 2019-02-15 MED ORDER — VITAMIN C 500 MG PO TABS
500.0000 mg | ORAL_TABLET | Freq: Every day | ORAL | Status: DC
  Administered 2019-02-15 – 2019-02-19 (×5): 500 mg via ORAL
  Filled 2019-02-15 (×5): qty 1

## 2019-02-15 MED ORDER — METHYLPREDNISOLONE SODIUM SUCC 40 MG IJ SOLR
40.0000 mg | Freq: Two times a day (BID) | INTRAMUSCULAR | Status: AC
  Administered 2019-02-15 – 2019-02-17 (×6): 40 mg via INTRAVENOUS
  Filled 2019-02-15 (×6): qty 1

## 2019-02-15 MED ORDER — FAMOTIDINE 20 MG PO TABS
20.0000 mg | ORAL_TABLET | Freq: Every day | ORAL | Status: DC
  Administered 2019-02-15 – 2019-02-19 (×5): 20 mg via ORAL
  Filled 2019-02-15 (×5): qty 1

## 2019-02-15 MED ORDER — ZINC SULFATE 220 (50 ZN) MG PO CAPS
220.0000 mg | ORAL_CAPSULE | Freq: Every day | ORAL | Status: DC
  Administered 2019-02-15 – 2019-02-19 (×5): 220 mg via ORAL
  Filled 2019-02-15 (×5): qty 1

## 2019-02-15 NOTE — Progress Notes (Signed)
Called and updated patient's niece, Holley Raring.  All concerns addressed.  Earleen Reaper RN

## 2019-02-15 NOTE — Plan of Care (Signed)
Pt had uneventful day. No c/o pain. VSS. Pt was up in chair for a few hours. Now weaned down to 2.5L [place]. Call bell within reach. Able to voice needs. Will continue plan of care   Problem: Education: Goal: Knowledge of General Education information will improve Description: Including pain rating scale, medication(s)/side effects and non-pharmacologic comfort measures Outcome: Progressing   Problem: Health Behavior/Discharge Planning: Goal: Ability to manage health-related needs will improve Outcome: Progressing   Problem: Clinical Measurements: Goal: Ability to maintain clinical measurements within normal limits will improve Outcome: Progressing Goal: Will remain free from infection Outcome: Progressing Goal: Diagnostic test results will improve Outcome: Progressing Goal: Respiratory complications will improve Outcome: Progressing Goal: Cardiovascular complication will be avoided Outcome: Progressing   Problem: Activity: Goal: Risk for activity intolerance will decrease Outcome: Progressing   Problem: Nutrition: Goal: Adequate nutrition will be maintained Outcome: Progressing   Problem: Coping: Goal: Level of anxiety will decrease Outcome: Progressing   Problem: Elimination: Goal: Will not experience complications related to bowel motility Outcome: Progressing Goal: Will not experience complications related to urinary retention Outcome: Progressing   Problem: Pain Managment: Goal: General experience of comfort will improve Outcome: Progressing   Problem: Safety: Goal: Ability to remain free from injury will improve Outcome: Progressing   Problem: Skin Integrity: Goal: Risk for impaired skin integrity will decrease Outcome: Progressing

## 2019-02-15 NOTE — Progress Notes (Addendum)
PROGRESS NOTE  Terrance Huynh GNF:[id number] DOB: 11-16-1975 DOA: 02/13/2019  PCP: Patient, No Pcp Per  Brief History/Interval Summary: 43 y.o. male no past medical history presented to the ED complaining of shortness of breath malaise for 2 weeks. In the ER was found with a temperature of 100.3, chest x-ray showed bilateral infiltrates requiring 2 L of oxygen to keep saturation above 94%.  Patient was hospitalized for further management. Symptoms started as "per patient" 7.20.2020   Reason for Visit: Acute respiratory disease due to COVID-19  Consultants: None  Procedures: None  Antibiotics: Anti-infectives (From admission, onward)   Start     Dose/Rate Route Frequency Ordered Stop   02/15/19 0600  remdesivir 100 mg in sodium chloride 0.9 % 250 mL IVPB     100 mg 500 mL/hr over 30 Minutes Intravenous Every 24 hours 02/14/19 0235 02/19/19 0559   02/14/19 0330  remdesivir 200 mg in sodium chloride 0.9 % 250 mL IVPB     200 mg 500 mL/hr over 30 Minutes Intravenous Once 02/14/19 0235 02/14/19 0345       Subjective/Interval History: Interpreter services were utilized.  Patient states that he is feeling slightly better compared to yesterday.  Denies any chest pain.  Shortness of breath still present though slightly improved.  Cough with yellow expectoration.  Denies any nausea or vomiting.     Assessment/Plan:  Acute Hypoxic Resp. Failure due to Acute Covid 19 Viral Illness     No results found for: PHART, PCO2ART, PO2ART, HCO3, TCO2, ACIDBASEDEF, O2SAT  COVID-19 Labs  Recent Labs    02/13/19 2122 02/14/19 0425 02/15/19 0340  DDIMER 0.91*  --  0.52*  FERRITIN 694* 655* 1,061*  LDH 445* 447*  --   CRP 21.2* 22.5* 23.7*    Lab Results  Component Value Date   SARSCOV2NAA POSITIVE (A) 02/13/2019     Fever: No fever in the last 24 hours.  T-max of 103 on 8/3 Oxygen requirements: Currently on oxygen by nasal cannula at 3 to 4 L/min Antibacterials:  None Remdesivir: Day 3 Steroids: Solu-Medrol 40 mg twice a day Diuretics: Not on a scheduled basis Actemra: Dose given on 8/4 Vitamin C and Zinc: Will initiate DVT Prophylaxis:  Lovenox 40 mg every 24 hours  Research Studies: Not enrolled into any studies yet  Patient's respiratory status remained stable.  He still requiring 3 to 4 L of oxygen by nasal cannula.  His inflammatory markers are still significantly elevated.  However symptomatically he feels slightly better.  Continue with the steroids Remdesivir.  He was given a dose of Actemra yesterday.  Will not repeat dose at this time.  Continue to trend inflammatory markers.  Incentive spirometry, mobilization, out of bed to chair.  Awake prone positioning as much as tolerated.  Procalcitonin level was 0.4. And 0.67 this morning.  No clear indication to start antibiotics..  Blood cultures negative so far.  The treatment plan and use of medications and known side effects were discussed with patient.  Some of the medications used are based on case reports/anecdotal data which are not peer-reviewed and has not been studied using randomized control trials.  Complete risks and long-term side effects are unknown, however in the best clinical judgment they seem to be of some benefit.  Patient agrees with the treatment plan and want to receive these treatments as indicated.  Patient started on steroids and given a dose of Actemra.  Hypovolemic hyponatremia Improved with IV fluids.  Continue to monitor  periodically.  Obesity Estimated body mass index is 32.07 kg/m as calculated from the following:   Height as of this encounter: 5' 2.99" (1.6 m).   Weight as of this encounter: 82.1 kg.  DVT Prophylaxis: Lovenox PUD Prophylaxis: Initiate Pepcid Code Status: Full code Family Communication: Discussed with the patient.  Will call his family later today Disposition Plan: Remain in hospital for now.  He still requiring oxygen.   Medications:   Scheduled:  enoxaparin (LOVENOX) injection  40 mg Subcutaneous Q24H   mouth rinse  15 mL Mouth Rinse BID   methylPREDNISolone (SOLU-MEDROL) injection  40 mg Intravenous Q12H   Continuous:  remdesivir 100 mg in NS 250 mL 100 mg (02/15/19 0610)   ZOX:[id number]:acetaminophen **OR** acetaminophen, guaiFENesin-dextromethorphan, ondansetron **OR** ondansetron (ZOFRAN) IV   Objective:  Vital Signs  Vitals:   02/14/19 1645 02/14/19 1928 02/15/19 0427 02/15/19 0745  BP: 109/83 107/74 109/76 110/78  Pulse: 72 69 68 76  Resp: (!) 25 (!) 32 19   Temp: 98 F (36.7 C) 97.9 F (36.6 C) (!) 97.5 F (36.4 C) 97.6 F (36.4 C)  TempSrc: Oral Oral Axillary Oral  SpO2: 95% 97% 96% 95%  Weight:      Height:        Intake/Output Summary (Last 24 hours) at 02/15/2019 1046 Last data filed at 02/15/2019 0900 Gross per 24 hour  Intake --  Output 1200 ml  Net -1200 ml   Filed Weights   02/14/19 0242  Weight: 82.1 kg    General appearance: Awake alert.  In no distress Resp: Mildly tachypneic at rest.  Coarse breath sounds with crackles at the bases.  No wheezing or rhonchi. Cardio: S1-S2 is normal regular.  No S3-S4.  No rubs murmurs or bruit GI: Abdomen is soft.  Nontender nondistended.  Bowel sounds are present normal.  No masses organomegaly Extremities: No edema.  Full range of motion of lower extremities. Neurologic: Alert and oriented x3.  No focal neurological deficits.    Lab Results:  Data Reviewed: I have personally reviewed following labs and imaging studies  CBC: Recent Labs  Lab 02/13/19 2122 02/14/19 0425 02/15/19 0340  WBC 8.6 8.4 11.0*  NEUTROABS 6.9 6.6  --   HGB 13.7 13.1 12.9*  HCT 40.8 39.5 39.5  MCV 88.3 89.6 89.8  PLT 251 267 338    Basic Metabolic Panel: Recent Labs  Lab 02/13/19 2122 02/14/19 0425 02/15/19 0340  NA 132*  --  137  K 3.6  --  4.0  CL 97*  --  104  CO2 20*  --  25  GLUCOSE 107*  --  140*  BUN 15  --  20  CREATININE 0.99 0.94 0.77   CALCIUM 8.5*  --  9.0    GFR: Estimated Creatinine Clearance: 114 mL/min (by C-G formula based on SCr of 0.77 mg/dL).  Liver Function Tests: Recent Labs  Lab 02/13/19 2122 02/15/19 0340  AST 34 26  ALT 27 28  ALKPHOS 46 46  BILITOT 1.2 0.5  PROT 8.1 7.6  ALBUMIN 3.4* 3.1*    Lipid Profile: Recent Labs    02/13/19 2122  TRIG 123    Anemia Panel: Recent Labs    02/14/19 0425 02/15/19 0340  FERRITIN 655* 1,061*    Recent Results (from the past 240 hour(s))  SARS Coronavirus 2 [facility](Hospital order, Performed [facility]) Nasopharyngeal Nasopharyngeal Swab     Status: Abnormal   Collection Time: 02/13/19  9:23 PM  Specimen: Nasopharyngeal Swab  Result Value Ref Range Status   SARS Coronavirus 2 POSITIVE (A) NEGATIVE Final    Comment: RESULT CALLED TO, READ BACK BY AND VERIFIED WITH: DOSTER,T @ 2319 ON [id number] BY POTEAT,S (NOTE) If result is NEGATIVE SARS-CoV-2 target nucleic acids are NOT DETECTED. The SARS-CoV-2 RNA is generally detectable in upper and lower  respiratory specimens during the acute phase of infection. The lowest  concentration of SARS-CoV-2 viral copies this assay can detect is 250  copies / mL. A negative result does not preclude SARS-CoV-2 infection  and should not be used as the sole basis for treatment or other  patient management decisions.  A negative result may occur with  improper specimen collection / handling, submission of specimen other  than nasopharyngeal swab, presence of viral mutation(s) within the  areas targeted by this assay, and inadequate number of viral copies  (<250 copies / mL). A negative result must be combined with clinical  observations, patient history, and epidemiological information. If result is POSITIVE SARS-CoV-2 target nucleic acids are DETECTED.  The SARS-CoV-2 RNA is generally detectable in upper and lower  respiratory specimens during the acute phase of infection.  Positive  results are  indicative of active infection with SARS-CoV-2.  Clinical  correlation with patient history and other diagnostic information is  necessary to determine patient infection status.  Positive results do  not rule out bacterial infection or co-infection with other viruses. If result is PRESUMPTIVE POSTIVE SARS-CoV-2 nucleic acids MAY BE PRESENT.   A presumptive positive result was obtained on the submitted specimen  and confirmed on repeat testing.  While 2019 novel coronavirus  (SARS-CoV-2) nucleic acids may be present in the submitted sample  additional confirmatory testing may be necessary for epidemiological  and / or clinical management purposes  to differentiate between  SARS-CoV-2 and other Sarbecovirus currently known to infect humans.  If clinically indicated additional testing with an alternate test  methodology [phone]([id number])  is advised. The SARS-CoV-2 RNA is generally  detectable in upper and lower respiratory specimens during the acute  phase of infection. The expected result is Negative. Fact Sheet for Patients:  BoilerBrush.com.cyhttps://www.fda.gov/media/136312/download Fact Sheet for Healthcare Providers: https://pope.com/https://www.fda.gov/media/136313/download This test is not yet approved or cleared by the [place] FDA and has been authorized for detection and/or diagnosis of SARS-CoV-2 by FDA under an Emergency Use Authorization (EUA).  This EUA will remain in effect (meaning this test can be used) for the duration of the COVID-19 declaration under Section 564(b)(1) of the Act, 21 U.S.C. section 360bbb-3(b)(1), unless the authorization is terminated or revoked sooner. Performed at [facility], [address] [place], [place] [address]   Blood Culture (routine x 2)     Status: None (Preliminary result)   Collection Time: 02/13/19  9:53 PM   Specimen: BLOOD  Result Value Ref Range Status   Specimen Description   Final    BLOOD LEFT ANTECUBITAL Performed at [facility], [address] [place], [place] [address]    Special Requests   Final    BOTTLES DRAWN AEROBIC AND ANAEROBIC Blood Culture results may not be optimal due to an excessive volume of blood received in culture bottles Performed at [facility], [address] [place], [place] [address]    Culture   Final    NO GROWTH < 24 HOURS Performed at [facility], [address] [place], [place] [address]    Report Status PENDING  Incomplete  Blood Culture (routine x 2)     Status: None (Preliminary result)   Collection Time: 02/13/19  9:53 PM   Specimen: BLOOD  Result Value Ref Range Status   Specimen Description   Final    BLOOD RIGHT ANTECUBITAL Performed at [facility], [address] [place], [place] [address]    Special Requests   Final    BOTTLES DRAWN AEROBIC AND ANAEROBIC Blood Culture results may not be optimal due to an excessive volume of blood received in culture bottles Performed at [facility], [address] [place], [place] [address]    Culture   Final    NO GROWTH < 24 HOURS Performed at [facility], [address] [place], [place] [address]    Report Status PENDING  Incomplete      Radiology Studies: Dg Chest Port 1 View  Result Date: 02/13/2019 CLINICAL DATA:  Fevers and shortness of breath for 2 weeks EXAM: PORTABLE CHEST 1 VIEW COMPARISON:  None. FINDINGS: Cardiac shadow is prominent but accentuated by the portable technique. Patchy opacities are noted throughout both lungs which may represent mild congestive failure although multifocal pneumonia deserves consideration as well. No bony abnormality is seen. IMPRESSION: Multifocal opacities likely related to a multifocal pneumonia. Electronically Signed   By: Alcide CleverMark  Lukens M.D.   On: 02/13/2019 21:52       LOS: 2 days   Erle Guster Rito EhrlichKrishnan  [facility] Pager on [email or web address]  02/15/2019, 10:46 AM

## 2019-02-16 LAB — CBC
HCT: 40.2 % (ref 39.0–52.0)
Hemoglobin: 13.2 g/dL (ref 13.0–17.0)
MCH: 29.9 pg (ref 26.0–34.0)
MCHC: 32.8 g/dL (ref 30.0–36.0)
MCV: 91 fL (ref 80.0–100.0)
Platelets: 425 10*3/uL — ABNORMAL HIGH (ref 150–400)
RBC: 4.42 MIL/uL (ref 4.22–5.81)
RDW: 13.1 % (ref 11.5–15.5)
WBC: 14.8 10*3/uL — ABNORMAL HIGH (ref 4.0–10.5)
nRBC: 0 % (ref 0.0–0.2)

## 2019-02-16 LAB — COMPREHENSIVE METABOLIC PANEL
ALT: 31 U/L (ref 0–44)
AST: 26 U/L (ref 15–41)
Albumin: 3.1 g/dL — ABNORMAL LOW (ref 3.5–5.0)
Alkaline Phosphatase: 51 U/L (ref 38–126)
Anion gap: 12 (ref 5–15)
BUN: 21 mg/dL — ABNORMAL HIGH (ref 6–20)
CO2: 23 mmol/L (ref 22–32)
Calcium: 8.8 mg/dL — ABNORMAL LOW (ref 8.9–10.3)
Chloride: 103 mmol/L (ref 98–111)
Creatinine, Ser: 0.84 mg/dL (ref 0.61–1.24)
GFR calc Af Amer: >60 mL/min (ref >60)
GFR calc non Af Amer: >60 mL/min (ref >60)
Glucose, Bld: 133 mg/dL — ABNORMAL HIGH (ref 70–99)
Potassium: 4.5 mmol/L (ref 3.5–5.1)
Sodium: 138 mmol/L (ref 135–145)
Total Bilirubin: 0.7 mg/dL (ref 0.3–1.2)
Total Protein: 7.4 g/dL (ref 6.5–8.1)

## 2019-02-16 LAB — C-REACTIVE PROTEIN: CRP: 12.5 mg/dL — ABNORMAL HIGH (ref <1.0)

## 2019-02-16 LAB — FERRITIN: Ferritin: 891 ng/mL — ABNORMAL HIGH (ref 24–336)

## 2019-02-16 LAB — D-DIMER, QUANTITATIVE: D-Dimer, Quant: 0.46 ug/mL-FEU (ref 0.00–0.50)

## 2019-02-16 NOTE — Progress Notes (Signed)
PROGRESS NOTE  Terrance Huynh ZOX:[id number] DOB: Nov 30, 1975 DOA: 02/13/2019  PCP: Patient, No Pcp Per  Brief History/Interval Summary: 43 y.o. male no past medical history presented to the ED complaining of shortness of breath malaise for 2 weeks. In the ER was found with a temperature of 100.3, chest x-ray showed bilateral infiltrates requiring 2 L of oxygen to keep saturation above 94%.  Patient was hospitalized for further management. Symptoms started as "per patient" 7.20.2020   Reason for Visit: Acute respiratory disease due to COVID-19  Consultants: None  Procedures: None  Antibiotics: Anti-infectives (From admission, onward)   Start     Dose/Rate Route Frequency Ordered Stop   02/15/19 0600  remdesivir 100 mg in sodium chloride 0.9 % 250 mL IVPB     100 mg 500 mL/hr over 30 Minutes Intravenous Every 24 hours 02/14/19 0235 02/19/19 0759   02/14/19 0330  remdesivir 200 mg in sodium chloride 0.9 % 250 mL IVPB     200 mg 500 mL/hr over 30 Minutes Intravenous Once 02/14/19 0235 02/14/19 0345       Subjective/Interval History: Interpreter services were utilized.  Patient states that he is feeling slightly better today.  Less cough.  Still has discomfort in his chest when he coughs.  No nausea vomiting.       Assessment/Plan:  Acute Hypoxic Resp. Failure due to Acute Covid 19 Viral Illness  COVID-19 Labs  Recent Labs    02/13/19 2122 02/14/19 0425 02/15/19 0340 02/16/19 0340  DDIMER 0.91*  --  0.52* 0.46  FERRITIN 694* 655* 1,061* 891*  LDH 445* 447*  --   --   CRP 21.2* 22.5* 23.7* 12.5*    Lab Results  Component Value Date   SARSCOV2NAA POSITIVE (A) 02/13/2019     Fever: Afebrile the last 48 hours T-max of 103 on 8/3 Oxygen requirements: Down to 2 to 3 L of oxygen by nasal cannula.  Saturating in the 90s. Antibacterials: None Remdesivir: Day 3? Steroids: Solu-Medrol 40 mg twice a day Diuretics: Not on a scheduled basis Actemra: Dose given  on 8/4 Vitamin C and Zinc: Continue DVT Prophylaxis:  Lovenox 40 mg every 24 hours  Research Studies: Not enrolled into any studies yet  Patient's respiratory status is slowly improving.  He still is requiring oxygen.  However his requirements have decreased.  His inflammatory markers have been improving.  He remains on Remdesivir and steroids.  He was given 1 dose of Actemra.  Considering improvement in clinical status hold off on further doses.  Leukocytosis is most likely due to steroids.  Continue incentive spirometry, mobilization, out of bed to chair.  Awake prone positioning as much as possible.  No antibacterials as his presentation is consistent with a pneumonia due to COVID-19.  Blood cultures negative so far.  The treatment plan and use of medications and known side effects were discussed with patient.  Some of the medications used are based on case reports/anecdotal data which are not peer-reviewed and has not been studied using randomized control trials.  Complete risks and long-term side effects are unknown, however in the best clinical judgment they seem to be of some benefit.  Patient agrees with the treatment plan and want to receive these treatments as indicated.  Patient started on steroids and given a dose of Actemra.  Hypovolemic hyponatremia Resolved with IV fluids.    Obesity Estimated body mass index is 32.07 kg/m as calculated from the following:   Height as of this  encounter: 5' 2.99" (1.6 m).   Weight as of this encounter: 82.1 kg.  DVT Prophylaxis: Lovenox PUD Prophylaxis: Pepcid Code Status: Full code Family Communication: Discussed with the patient.  Discussed with his niece yesterday.  We will call her again today. Disposition Plan: Remain in hospital for now.  He still requiring oxygen.   Medications:  Scheduled: . enoxaparin (LOVENOX) injection  40 mg Subcutaneous Q24H  . famotidine  20 mg Oral Daily  . mouth rinse  15 mL Mouth Rinse BID  .  methylPREDNISolone (SOLU-MEDROL) injection  40 mg Intravenous Q12H  . vitamin C  500 mg Oral Daily  . zinc sulfate  220 mg Oral Daily   Continuous: . remdesivir 100 mg in NS 250 mL 100 mg (02/16/19 0809)   ZOX:WRUEAVWUJWJXB **OR** acetaminophen, guaiFENesin-dextromethorphan, ondansetron **OR** ondansetron (ZOFRAN) IV   Objective:  Vital Signs  Vitals:   02/16/19 0200 02/16/19 0233 02/16/19 0400 02/16/19 0437  BP:    120/89  Pulse: (!) 50 93 (!) 52 (!) 58  Resp: (!) 25 (!) 23 (!) 23 (!) 24  Temp:    97.7 F (36.5 C)  TempSrc:    Oral  SpO2: 96% (!) 88% 93% 100%  Weight:      Height:        Intake/Output Summary (Last 24 hours) at 02/16/2019 0955 Last data filed at 02/16/2019 0900 Gross per 24 hour  Intake 1460.03 ml  Output 2050 ml  Net -589.97 ml   Filed Weights   02/14/19 0242  Weight: 82.1 kg   General appearance: Awake alert.  In no distress Resp: Mildly tachypneic at rest.  Coarse breath sounds with crackles at the bases.  No rales or rhonchi.   Cardio: S1-S2 is normal regular.  No S3-S4.  No rubs murmurs or bruit GI: Abdomen is soft.  Nontender nondistended.  Bowel sounds are present normal.  No masses organomegaly Extremities: No edema.  Full range of motion of lower extremities. Neurologic: Alert and oriented x3.  No focal neurological deficits.    Lab Results:  Data Reviewed: I have personally reviewed following labs and imaging studies  CBC: Recent Labs  Lab 02/13/19 2122 02/14/19 0425 02/15/19 0340 02/16/19 0340  WBC 8.6 8.4 11.0* 14.8*  NEUTROABS 6.9 6.6  --   --   HGB 13.7 13.1 12.9* 13.2  HCT 40.8 39.5 39.5 40.2  MCV 88.3 89.6 89.8 91.0  PLT 251 267 338 425*    Basic Metabolic Panel: Recent Labs  Lab 02/13/19 2122 02/14/19 0425 02/15/19 0340 02/16/19 0340  NA 132*  --  137 138  K 3.6  --  4.0 4.5  CL 97*  --  104 103  CO2 20*  --  25 23  GLUCOSE 107*  --  140* 133*  BUN 15  --  20 21*  CREATININE 0.99 0.94 0.77 0.84  CALCIUM 8.5*   --  9.0 8.8*    GFR: Estimated Creatinine Clearance: 108.6 mL/min (by C-G formula based on SCr of 0.84 mg/dL).  Liver Function Tests: Recent Labs  Lab 02/13/19 2122 02/15/19 0340 02/16/19 0340  AST 34 26 26  ALT 27 28 31   ALKPHOS 46 46 51  BILITOT 1.2 0.5 0.7  PROT 8.1 7.6 7.4  ALBUMIN 3.4* 3.1* 3.1*    Lipid Profile: Recent Labs    02/13/19 2122  TRIG 123    Anemia Panel: Recent Labs    02/15/19 0340 02/16/19 0340  FERRITIN 1,061* 891*    Recent Results (  from the past 240 hour(s))  SARS Coronavirus 2 [facility](Hospital order, Performed in [facility] hospital lab) Nasopharyngeal Nasopharyngeal Swab     Status: Abnormal   Collection Time: 02/13/19  9:23 PM   Specimen: Nasopharyngeal Swab  Result Value Ref Range Status   SARS Coronavirus 2 POSITIVE (A) NEGATIVE Final    Comment: RESULT CALLED TO, READ BACK BY AND VERIFIED WITH: DOSTER,T @ 2319 ON [id number] BY POTEAT,S (NOTE) If result is NEGATIVE SARS-CoV-2 target nucleic acids are NOT DETECTED. The SARS-CoV-2 RNA is generally detectable in upper and lower  respiratory specimens during the acute phase of infection. The lowest  concentration of SARS-CoV-2 viral copies this assay can detect is 250  copies / mL. A negative result does not preclude SARS-CoV-2 infection  and should not be used as the sole basis for treatment or other  patient management decisions.  A negative result may occur with  improper specimen collection / handling, submission of specimen other  than nasopharyngeal swab, presence of viral mutation(s) within the  areas targeted by this assay, and inadequate number of viral copies  (<250 copies / mL). A negative result must be combined with clinical  observations, patient history, and epidemiological information. If result is POSITIVE SARS-CoV-2 target nucleic acids are DETECTED.  The SARS-CoV-2 RNA is generally detectable in upper and lower  respiratory specimens during the acute phase of infection.   Positive  results are indicative of active infection with SARS-CoV-2.  Clinical  correlation with patient history and other diagnostic information is  necessary to determine patient infection status.  Positive results do  not rule out bacterial infection or co-infection with other viruses. If result is PRESUMPTIVE POSTIVE SARS-CoV-2 nucleic acids MAY BE PRESENT.   A presumptive positive result was obtained on the submitted specimen  and confirmed on repeat testing.  While 2019 novel coronavirus  (SARS-CoV-2) nucleic acids may be present in the submitted sample  additional confirmatory testing may be necessary for epidemiological  and / or clinical management purposes  to differentiate between  SARS-CoV-2 and other Sarbecovirus currently known to infect humans.  If clinically indicated additional testing with an alternate test  methodology [phone]  is advised. The SARS-CoV-2 RNA is generally  detectable in upper and lower respiratory specimens during the acute  phase of infection. The expected result is Negative. Fact Sheet for Patients:  BoilerBrush.com.cyhttps://www.fda.gov/media/136312/download Fact Sheet for Healthcare Providers: https://pope.com/https://www.fda.gov/media/136313/download This test is not yet approved or cleared by the [place] FDA and has been authorized for detection and/or diagnosis of SARS-CoV-2 by FDA under an Emergency Use Authorization (EUA).  This EUA will remain in effect (meaning this test can be used) for the duration of the COVID-19 declaration under Section 564(b)(1) of the Act, 21 U.S.C. section 360bbb-3(b)(1), unless the authorization is terminated or revoked sooner. Performed at [facility], [address] [place], [place] [address]   Blood Culture (routine x 2)     Status: None (Preliminary result)   Collection Time: 02/13/19  9:53 PM   Specimen: BLOOD  Result Value Ref Range Status   Specimen Description   Final    BLOOD LEFT ANTECUBITAL Performed  at [facility], [address] [place], [place] [address]    Special Requests   Final    BOTTLES DRAWN AEROBIC AND ANAEROBIC Blood Culture results may not be optimal due to an excessive volume of blood received in culture bottles Performed at [facility], [address] [place], [place]  [address]    Culture   Final    NO GROWTH 2 DAYS Performed at [facility], [address] [place], [place] [address]    Report Status PENDING  Incomplete  Blood Culture (routine x 2)     Status: None (Preliminary result)   Collection Time: 02/13/19  9:53 PM   Specimen: BLOOD  Result Value Ref Range Status   Specimen Description   Final    BLOOD RIGHT ANTECUBITAL Performed at [facility], [address] [place], [place] [address]    Special Requests   Final    BOTTLES DRAWN AEROBIC AND ANAEROBIC Blood Culture results may not be optimal due to an excessive volume of blood received in culture bottles Performed at [facility], [address] [place], [place] [address]    Culture   Final    NO GROWTH 2 DAYS Performed at [facility], [address] [place], [place] [address]    Report Status PENDING  Incomplete      Radiology Studies: No results found.     LOS: 3 days   Khaleah Duer Foot LockerKrishnan  [facility] Pager on [email or web address]  02/16/2019, 9:55 AM

## 2019-02-17 DIAGNOSIS — J9601 Acute respiratory failure with hypoxia: Secondary | ICD-10-CM

## 2019-02-17 LAB — FERRITIN: Ferritin: 869 ng/mL — ABNORMAL HIGH (ref 24–336)

## 2019-02-17 LAB — D-DIMER, QUANTITATIVE: D-Dimer, Quant: 0.63 ug/mL-FEU — ABNORMAL HIGH (ref 0.00–0.50)

## 2019-02-17 LAB — C-REACTIVE PROTEIN: CRP: 8.2 mg/dL — ABNORMAL HIGH (ref <1.0)

## 2019-02-17 MED ORDER — PREDNISONE 20 MG PO TABS
40.0000 mg | ORAL_TABLET | Freq: Every day | ORAL | Status: DC
  Administered 2019-02-18 – 2019-02-19 (×2): 40 mg via ORAL
  Filled 2019-02-17 (×2): qty 2

## 2019-02-17 NOTE — Progress Notes (Signed)
Patient ambulating in room, 90% on 2L [place]. No reported SOB, occasional cough

## 2019-02-17 NOTE — Progress Notes (Signed)
PROGRESS NOTE  Terrance Huynh WUJ:[id number] DOB: [birth year] DOA: 02/13/2019  PCP: Patient, No Pcp Per  Brief History/Interval Summary: 43 y.o. male no past medical history presented to the ED complaining of shortness of breath malaise for 2 weeks. In the ER was found with a temperature of 100.3, chest x-ray showed bilateral infiltrates requiring 2 L of oxygen to keep saturation above 94%.  Patient was hospitalized for further management. Symptoms started as "per patient" 7.20.2020   Reason for Visit: Acute respiratory disease due to COVID-19  Consultants: None  Procedures: None  Antibiotics: Anti-infectives (From admission, onward)   Start     Dose/Rate Route Frequency Ordered Stop   02/15/19 0600  remdesivir 100 mg in sodium chloride 0.9 % 250 mL IVPB     100 mg 500 mL/hr over 30 Minutes Intravenous Every 24 hours 02/14/19 0235 02/19/19 0759   02/14/19 0330  remdesivir 200 mg in sodium chloride 0.9 % 250 mL IVPB     200 mg 500 mL/hr over 30 Minutes Intravenous Once 02/14/19 0235 02/14/19 0345       Subjective/Interval History: Interpreter services were utilized.  Patient states that he is feeling better continues to have a cough however.  Shortness of breath has been improving.  He has been able to walk in the room.  Denies any nausea vomiting.        Assessment/Plan:  Acute Hypoxic Resp. Failure due to Acute Covid 19 Viral Illness  COVID-19 Labs  Recent Labs    02/15/19 0340 02/16/19 0340 02/17/19 0121  DDIMER 0.52* 0.46 0.63*  FERRITIN 1,061* 891* 869*  CRP 23.7* 12.5* 8.2*    Lab Results  Component Value Date   SARSCOV2NAA POSITIVE (A) 02/13/2019     Fever: Afebrile Oxygen requirements: Nasal cannula.  1.5 L/min.  Saturating in the 90s.   Antibacterials: None Remdesivir: Day 4 Steroids: Solu-Medrol 40 mg twice a day.  We will start tapering Diuretics: Not on a scheduled basis Actemra: Dose given on 8/4 Vitamin C and Zinc: Continue DVT  Prophylaxis:  Lovenox 40 mg every 24 hours  Research Studies: Not enrolled into any studies yet  Patient's respiratory status has improved.  He is now down to 1.5 L of oxygen by nasal cannula.  His inflammatory markers have significantly improved.  He remains on Remdesivir and steroids.  Steroids will be tapered down starting tomorrow.  He received 1 dose of Actemra.  Blood cultures were negative.  Continue awake pronating as much as possible.  Incentive spirometry and mobilization.  Check oxygen saturations with ambulation.  The treatment plan and use of medications and known side effects were discussed with patient.  Some of the medications used are based on case reports/anecdotal data which are not peer-reviewed and has not been studied using randomized control trials.  Complete risks and long-term side effects are unknown, however in the best clinical judgment they seem to be of some benefit.  Patient agrees with the treatment plan and want to receive these treatments as indicated.  Patient started on steroids and given a dose of Actemra.  Hypovolemic hyponatremia Resolved with IV fluids.    Obesity Estimated body mass index is 32.07 kg/m as calculated from the following:   Height as of this encounter: 5' 2.99" (1.6 m).   Weight as of this encounter: 82.1 kg.  DVT Prophylaxis: Lovenox PUD Prophylaxis: Pepcid Code Status: Full code Family Communication: Discussed with the patient.  Try to call his knees yesterday with no success.  Disposition Plan: Hopefully home in the next 24 to 48 hours.  He might need home oxygen.   Medications:  Scheduled: . enoxaparin (LOVENOX) injection  40 mg Subcutaneous Q24H  . famotidine  20 mg Oral Daily  . mouth rinse  15 mL Mouth Rinse BID  . methylPREDNISolone (SOLU-MEDROL) injection  40 mg Intravenous Q12H  . vitamin C  500 mg Oral Daily  . zinc sulfate  220 mg Oral Daily   Continuous: . remdesivir 100 mg in NS 250 mL 100 mg (02/17/19 0801)    WVP:XTGGYIRSWNIOE **OR** acetaminophen, guaiFENesin-dextromethorphan, ondansetron **OR** ondansetron (ZOFRAN) IV   Objective:  Vital Signs  Vitals:   02/16/19 1601 02/16/19 2004 02/17/19 0445 02/17/19 0742  BP: 96/70 113/72 107/85   Pulse: (!) 59 66    Resp: 20   18  Temp: (!) 97.5 F (36.4 C) 98.5 F (36.9 C) 97.8 F (36.6 C) (!) 97.5 F (36.4 C)  TempSrc: Oral Oral Oral Oral  SpO2: 94% 94%    Weight:      Height:        Intake/Output Summary (Last 24 hours) at 02/17/2019 0959 Last data filed at 02/17/2019 0743 Gross per 24 hour  Intake 590 ml  Output 2700 ml  Net -2110 ml   Filed Weights   02/14/19 0242  Weight: 82.1 kg    General appearance: Awake alert.  In no distress Resp: Normal effort at rest.  Coarse breath sounds bilaterally with crackles at the bases.  Improved air entry compared to the last 48 hours.  No wheezing or rhonchi.   Cardio: S1-S2 is normal regular.  No S3-S4.  No rubs murmurs or bruit GI: Abdomen is soft.  Nontender nondistended.  Bowel sounds are present normal.  No masses organomegaly Extremities: No edema.  Full range of motion of lower extremities. Neurologic: Alert and oriented x3.  No focal neurological deficits.     Lab Results:  Data Reviewed: I have personally reviewed following labs and imaging studies  CBC: Recent Labs  Lab 02/13/19 2122 02/14/19 0425 02/15/19 0340 02/16/19 0340  WBC 8.6 8.4 11.0* 14.8*  NEUTROABS 6.9 6.6  --   --   HGB 13.7 13.1 12.9* 13.2  HCT 40.8 39.5 39.5 40.2  MCV 88.3 89.6 89.8 91.0  PLT 251 267 338 425*    Basic Metabolic Panel: Recent Labs  Lab 02/13/19 2122 02/14/19 0425 02/15/19 0340 02/16/19 0340  NA 132*  --  137 138  K 3.6  --  4.0 4.5  CL 97*  --  104 103  CO2 20*  --  25 23  GLUCOSE 107*  --  140* 133*  BUN 15  --  20 21*  CREATININE 0.99 0.94 0.77 0.84  CALCIUM 8.5*  --  9.0 8.8*    GFR: Estimated Creatinine Clearance: 108.6 mL/min (by C-G formula based on SCr of 0.84  mg/dL).  Liver Function Tests: Recent Labs  Lab 02/13/19 2122 02/15/19 0340 02/16/19 0340  AST 34 26 26  ALT 27 28 31   ALKPHOS 46 46 51  BILITOT 1.2 0.5 0.7  PROT 8.1 7.6 7.4  ALBUMIN 3.4* 3.1* 3.1*     Anemia Panel: Recent Labs    02/16/19 0340 02/17/19 0121  FERRITIN 891* 869*    Recent Results (from the past 240 hour(s))  SARS Coronavirus 2 [facility] order, Performed in [facility] hospital lab) Nasopharyngeal Nasopharyngeal Swab     Status: Abnormal   Collection Time: 02/13/19  9:23 PM  Specimen: Nasopharyngeal Swab  Result Value Ref Range Status   SARS Coronavirus 2 POSITIVE (A) NEGATIVE Final    Comment: RESULT CALLED TO, READ BACK BY AND VERIFIED WITH: DOSTER,T @ 2319 ON [id number] BY POTEAT,S (NOTE) If result is NEGATIVE SARS-CoV-2 target nucleic acids are NOT DETECTED. The SARS-CoV-2 RNA is generally detectable in upper and lower  respiratory specimens during the acute phase of infection. The lowest  concentration of SARS-CoV-2 viral copies this assay can detect is 250  copies / mL. A negative result does not preclude SARS-CoV-2 infection  and should not be used as the sole basis for treatment or other  patient management decisions.  A negative result may occur with  improper specimen collection / handling, submission of specimen other  than nasopharyngeal swab, presence of viral mutation(s) within the  areas targeted by this assay, and inadequate number of viral copies  (<250 copies / mL). A negative result must be combined with clinical  observations, patient history, and epidemiological information. If result is POSITIVE SARS-CoV-2 target nucleic acids are DETECTED.  The SARS-CoV-2 RNA is generally detectable in upper and lower  respiratory specimens during the acute phase of infection.  Positive  results are indicative of active infection with SARS-CoV-2.  Clinical  correlation with patient history and other diagnostic information is  necessary to  determine patient infection status.  Positive results do  not rule out bacterial infection or co-infection with other viruses. If result is PRESUMPTIVE POSTIVE SARS-CoV-2 nucleic acids MAY BE PRESENT.   A presumptive positive result was obtained on the submitted specimen  and confirmed on repeat testing.  While 2019 novel coronavirus  (SARS-CoV-2) nucleic acids may be present in the submitted sample  additional confirmatory testing may be necessary for epidemiological  and / or clinical management purposes  to differentiate between  SARS-CoV-2 and other Sarbecovirus currently known to infect humans.  If clinically indicated additional testing with an alternate test  methodology [phone])  is advised. The SARS-CoV-2 RNA is generally  detectable in upper and lower respiratory specimens during the acute  phase of infection. The expected result is Negative. Fact Sheet for Patients:  BoilerBrush.com.cyhttps://www.fda.gov/media/136312/download Fact Sheet for Healthcare Providers: https://pope.com/https://www.fda.gov/media/136313/download This test is not yet approved or cleared by the [place] FDA and has been authorized for detection and/or diagnosis of SARS-CoV-2 by FDA under an Emergency Use Authorization (EUA).  This EUA will remain in effect (meaning this test can be used) for the duration of the COVID-19 declaration under Section 564(b)(1) of the Act, 21 U.S.C. section 360bbb-3(b)(1), unless the authorization is terminated or revoked sooner. Performed at [facility], [address] [place], [place] [address]   Blood Culture (routine x 2)     Status: None (Preliminary result)   Collection Time: 02/13/19  9:53 PM   Specimen: BLOOD  Result Value Ref Range Status   Specimen Description   Final    BLOOD LEFT ANTECUBITAL Performed at [facility], [address] [place], [place] [address]    Special Requests   Final    BOTTLES DRAWN AEROBIC AND ANAEROBIC Blood Culture  results may not be optimal due to an excessive volume of blood received in culture bottles Performed at [facility], [address] [place], [place] [address]    Culture   Final    NO GROWTH 3 DAYS Performed at [facility], [address] [place], [place] [address]    Report Status PENDING  Incomplete  Blood Culture (routine x 2)     Status: None (Preliminary result)   Collection Time: 02/13/19  9:53 PM   Specimen: BLOOD  Result Value Ref Range Status   Specimen Description   Final    BLOOD RIGHT ANTECUBITAL Performed at [facility], [address] [place], [place] [address]    Special Requests   Final    BOTTLES DRAWN AEROBIC AND ANAEROBIC Blood Culture results may not be optimal due to an excessive volume of blood received in culture bottles Performed at [facility], [address] [place], [place] [address]    Culture   Final    NO GROWTH 3 DAYS Performed at [facility], [address] [place], [place] [address]    Report Status PENDING  Incomplete      Radiology Studies: No results found.     LOS: 4 days   Kevontay Burks [employer]rishnan  [facility] Pager on [email or web address]  02/17/2019, 9:59 AM

## 2019-02-18 DIAGNOSIS — U071 COVID-19: Principal | ICD-10-CM

## 2019-02-18 LAB — CBC
HCT: 44.9 % (ref 39.0–52.0)
Hemoglobin: 14.4 g/dL (ref 13.0–17.0)
MCH: 29.1 pg (ref 26.0–34.0)
MCHC: 32.1 g/dL (ref 30.0–36.0)
MCV: 90.9 fL (ref 80.0–100.0)
Platelets: 553 10*3/uL — ABNORMAL HIGH (ref 150–400)
RBC: 4.94 MIL/uL (ref 4.22–5.81)
RDW: 13.1 % (ref 11.5–15.5)
WBC: 12.2 10*3/uL — ABNORMAL HIGH (ref 4.0–10.5)
nRBC: 0 % (ref 0.0–0.2)

## 2019-02-18 LAB — CULTURE, BLOOD (ROUTINE X 2)
Culture: NO GROWTH
Culture: NO GROWTH

## 2019-02-18 LAB — BASIC METABOLIC PANEL
Anion gap: 9 (ref 5–15)
BUN: 20 mg/dL (ref 6–20)
CO2: 25 mmol/L (ref 22–32)
Calcium: 9 mg/dL (ref 8.9–10.3)
Chloride: 103 mmol/L (ref 98–111)
Creatinine, Ser: 0.8 mg/dL (ref 0.61–1.24)
GFR calc Af Amer: >60 mL/min (ref >60)
GFR calc non Af Amer: >60 mL/min (ref >60)
Glucose, Bld: 148 mg/dL — ABNORMAL HIGH (ref 70–99)
Potassium: 4.6 mmol/L (ref 3.5–5.1)
Sodium: 137 mmol/L (ref 135–145)

## 2019-02-18 LAB — FERRITIN: Ferritin: 771 ng/mL — ABNORMAL HIGH (ref 24–336)

## 2019-02-18 LAB — D-DIMER, QUANTITATIVE: D-Dimer, Quant: 0.66 ug/mL-FEU — ABNORMAL HIGH (ref 0.00–0.50)

## 2019-02-18 LAB — C-REACTIVE PROTEIN: CRP: 4.6 mg/dL — ABNORMAL HIGH (ref <1.0)

## 2019-02-18 MED ORDER — ZINC SULFATE 220 (50 ZN) MG PO CAPS: 220.0000 mg | ORAL_CAPSULE | Freq: Every day | ORAL | 0 refills | Status: DC

## 2019-02-18 MED ORDER — GUAIFENESIN-DM 100-10 MG/5ML PO SYRP: 10.0000 mL | ORAL_SOLUTION | ORAL | 0 refills | Status: AC | PRN

## 2019-02-18 MED ORDER — PREDNISONE 20 MG PO TABS: ORAL_TABLET | ORAL | 0 refills | Status: DC

## 2019-02-18 MED ORDER — ASCORBIC ACID 500 MG PO TABS: 500.0000 mg | ORAL_TABLET | Freq: Every day | ORAL | 0 refills | Status: DC

## 2019-02-18 MED ORDER — FAMOTIDINE 20 MG PO TABS: 20.0000 mg | ORAL_TABLET | Freq: Every day | ORAL | 0 refills | Status: DC

## 2019-02-18 NOTE — Care Management (Addendum)
Case manager spoke with patient via [employer] interpreter[place] Huynh, HW#[id number]. Confirmed patient's phone number to be [phone], and address. Patient will be scheduled for f/u telephonic appointment on the next business day, Monday, 02/20/19. CM also explained that he will have oxygen at discharge. Confirmed address [place]. [place], [place]. Contact person is Jahiem Franzoni [phone].

## 2019-02-18 NOTE — Care Management (Addendum)
Case  checked work phone this pm and noted calls from the unit at 5:55pm and 6:15pm. Case manager phone goes off at 5:00pm.   CM called Charge nurse at 7:44pm to see what was the concern. Voice mail indicated that patient oxygen had not been delivered to his home. Case Manager asked that they look at [facility] note to see that it had indeed been ordered as documented. Case Manager at 7:41pm contacted Learta Codding, [place], who confirmed that orders were given to driver at 9:62 pm for this patient and delivered to his residence before 4:30 pm. Patient's wife, Terrance Huynh, was home to receive equipment. Case manager contacted Camera operator with this information.     Ricki Miller, RN BSN Case Manager [phone]

## 2019-02-18 NOTE — TOC Transition Note (Signed)
Transition of Care [facility]) - CM/SW Discharge Note   Patient Details  Name: Terrance Huynh MRN: [id number] Date of Birth: 08-12-1975  Transition of Care [facility] CM/SW Contact:  Ninfa Meeker, RN Phone Number: [phone] (working remotely) 02/18/2019, 2:06 PM   Clinical Narrative:  43 yr old gentleman admitted and treated for COVID 19, thankfully has improved and will discharge home. Case manager spoke with patient Via interpreter, [name] [phone], confirmed phone number and address. Case manager will arrange a telephonic hospital f/u appt for patient on Monday, 02/20/19. Referral for oxygen was called to Learta Codding, [employer] [employer] Liaison. No further HH needs identified.     Final next level of care: Home/Self Care Barriers to Discharge: No Barriers Identified   Patient Goals and CMS Choice Patient states their goals for this hospitalization and ongoing recovery are:: feel better   Choice offered to / list presented to : Patient  Discharge Placement                       Discharge Plan and Services   Discharge Planning Services: CM Consult Post Acute Care Choice: Durable Medical Equipment          DME Arranged: Oxygen DME Agency: [place] Date DME Agency Contacted: 02/18/19 Time DME Agency Contacted: [phone] Representative spoke with at DME Agency: Learta Codding [facility] Arranged: NA          Social Determinants of Health ([place]) Interventions     Readmission Risk Interventions No flowsheet data found.

## 2019-02-18 NOTE — Discharge Summary (Signed)
[facility]  Physician Discharge Summary   Patient ID: Terrance Huynh MRN: [id number] DOB/AGE: [birth year] 42 y.o.  Admit date: 02/13/2019 Discharge date: 02/18/2019  PCP: Patient, No Pcp Per  DISCHARGE DIAGNOSES:  Acute respiratory disease due to COVID-19  RECOMMENDATIONS FOR OUTPATIENT FOLLOW UP: 1. Needs tele-visit appointment for COVID-19   Home Health: None Equipment/Devices: Home oxygen  CODE STATUS: Full code  DISCHARGE CONDITION: fair  Diet recommendation: Regular as tolerated  INITIAL HISTORY: 43 y.o.maleno past medical history presented to the ED complaining of shortness of breath malaise for 2 weeks.In the ER was found with a temperature of 100.3, chest x-ray showed bilateral infiltrates requiring 2 L of oxygen to keep saturation above 94%.  Patient was hospitalized for further management. Symptoms started as "per patient" 7.20.2020   HOSPITAL COURSE:   Acute Hypoxic Resp. Failure due to Acute Covid 19 Viral Illness Patient was hospitalized.  Patient placed on steroids and Remdesivir.  Given a dose of Actemra.  Patient symptoms started improving.  His respiratory status has improved.  He is able to ambulate.  He still requiring oxygen at 2 L/min.  He will be sent home on home oxygen.  He will complete course of Remdesivir today.  Will also be discharged on steroid taper.  Inflammatory markers have been improving.  Hypovolemic hyponatremia Resolved with IV fluids.    Obesity Estimated body mass index is 32.07 kg/m as calculated from the following:   Height as of this encounter: 5' 2.99" (1.6 m).   Weight as of this encounter: 82.1 kg.  Noted to have sinus bradycardia while asleep.  Asymptomatic.  Overall stable.  Okay for discharge home today.     PERTINENT LABS:  The results of significant diagnostics from this hospitalization (including imaging, microbiology, ancillary and laboratory) are listed below for reference.     Microbiology: Recent Results (from the past 240 hour(s))  SARS Coronavirus 2 [facility] order, Performed in [facility] hospital lab) Nasopharyngeal Nasopharyngeal Swab     Status: Abnormal   Collection Time: 02/13/19  9:23 PM   Specimen: Nasopharyngeal Swab  Result Value Ref Range Status   SARS Coronavirus 2 POSITIVE (A) NEGATIVE Final    Comment: RESULT CALLED TO, READ BACK BY AND VERIFIED WITH: DOSTER,T @ 2319 ON [id number] BY POTEAT,S (NOTE) If result is NEGATIVE SARS-CoV-2 target nucleic acids are NOT DETECTED. The SARS-CoV-2 RNA is generally detectable in upper and lower  respiratory specimens during the acute phase of infection. The lowest  concentration of SARS-CoV-2 viral copies this assay can detect is 250  copies / mL. A negative result does not preclude SARS-CoV-2 infection  and should not be used as the sole basis for treatment or other  patient management decisions.  A negative result may occur with  improper specimen collection / handling, submission of specimen other  than nasopharyngeal swab, presence of viral mutation(s) within the  areas targeted by this assay, and inadequate number of viral copies  (<250 copies / mL). A negative result must be combined with clinical  observations, patient history, and epidemiological information. If result is POSITIVE SARS-CoV-2 target nucleic acids are DETECTED.  The SARS-CoV-2 RNA is generally detectable in upper and lower  respiratory specimens during the acute phase of infection.  Positive  results are indicative of active infection with SARS-CoV-2.  Clinical  correlation with patient history and other diagnostic information is  necessary to determine patient infection status.  Positive results do  not rule out bacterial infection or co-infection  with other viruses. If result is PRESUMPTIVE POSTIVE SARS-CoV-2 nucleic acids MAY BE PRESENT.   A presumptive positive result was obtained on the submitted specimen  and confirmed on  repeat testing.  While 2019 novel coronavirus  (SARS-CoV-2) nucleic acids may be present in the submitted sample  additional confirmatory testing may be necessary for epidemiological  and / or clinical management purposes  to differentiate between  SARS-CoV-2 and other Sarbecovirus currently known to infect humans.  If clinically indicated additional testing with an alternate test  methodology [phone]  is advised. The SARS-CoV-2 RNA is generally  detectable in upper and lower respiratory specimens during the acute  phase of infection. The expected result is Negative. Fact Sheet for Patients:  BoilerBrush.com.cyhttps://www.fda.gov/media/136312/download Fact Sheet for Healthcare Providers: https://pope.com/https://www.fda.gov/media/136313/download This test is not yet approved or cleared by the [place] FDA and has been authorized for detection and/or diagnosis of SARS-CoV-2 by FDA under an Emergency Use Authorization (EUA).  This EUA will remain in effect (meaning this test can be used) for the duration of the COVID-19 declaration under Section 564(b)(1) of the Act, 21 U.S.C. section 360bbb-3(b)(1), unless the authorization is terminated or revoked sooner. Performed at [facility], [address] [place], [place] [address]   Blood Culture (routine x 2)     Status: None   Collection Time: 02/13/19  9:53 PM   Specimen: BLOOD  Result Value Ref Range Status   Specimen Description   Final    BLOOD LEFT ANTECUBITAL Performed at [facility], [address] [place], [place] [address]    Special Requests   Final    BOTTLES DRAWN AEROBIC AND ANAEROBIC Blood Culture results may not be optimal due to an excessive volume of blood received in culture bottles Performed at [facility], [address] [place], [place] [address]    Culture   Final    NO GROWTH 5 DAYS Performed at [facility], [address] [place], [place] [address]    Report Status  02/18/2019 FINAL  Final  Blood Culture (routine x 2)     Status: None   Collection Time: 02/13/19  9:53 PM   Specimen: BLOOD  Result Value Ref Range Status   Specimen Description   Final    BLOOD RIGHT ANTECUBITAL Performed at [facility], [address] [place], [place] [address]    Special Requests   Final    BOTTLES DRAWN AEROBIC AND ANAEROBIC Blood Culture results may not be optimal due to an excessive volume of blood received in culture bottles Performed at [facility], [address] [place], [place] [address]    Culture   Final    NO GROWTH 5 DAYS Performed at [facility], [address] [place], [place] [address]    Report Status 02/18/2019 FINAL  Final     Labs:  COVID-19 Labs  Recent Labs    02/16/19 0340 02/17/19 0121 02/18/19 0133  DDIMER 0.46 0.63* 0.66*  FERRITIN 891* 869* 771*  CRP 12.5* 8.2* 4.6*    Lab Results  Component Value Date   SARSCOV2NAA POSITIVE (A) 02/13/2019     Basic Metabolic Panel: Recent Labs  Lab 02/13/19 2122 02/14/19 0425 02/15/19 0340 02/16/19 0340 02/18/19 0133  NA 132*  --  137 138 137  K 3.6  --  4.0 4.5 4.6  CL 97*  --  104 103 103  CO2 20*  --  25 23  25  GLUCOSE 107*  --  140* 133* 148*  BUN 15  --  20 21* 20  CREATININE 0.99 0.94 0.77 0.84 0.80  CALCIUM 8.5*  --  9.0 8.8* 9.0   Liver Function Tests: Recent Labs  Lab 02/13/19 2122 02/15/19 0340 02/16/19 0340  AST 34 26 26  ALT 27 28 31   ALKPHOS 46 46 51  BILITOT 1.2 0.5 0.7  PROT 8.1 7.6 7.4  ALBUMIN 3.4* 3.1* 3.1*   CBC: Recent Labs  Lab 02/13/19 2122 02/14/19 0425 02/15/19 0340 02/16/19 0340 02/18/19 0133  WBC 8.6 8.4 11.0* 14.8* 12.2*  NEUTROABS 6.9 6.6  --   --   --   HGB 13.7 13.1 12.9* 13.2 14.4  HCT 40.8 39.5 39.5 40.2 44.9  MCV 88.3 89.6 89.8 91.0 90.9  PLT 251 267 338 425* 553*     IMAGING STUDIES Dg Chest Port 1 View  Result Date: 02/13/2019 CLINICAL DATA:  Fevers and shortness  of breath for 2 weeks EXAM: PORTABLE CHEST 1 VIEW COMPARISON:  None. FINDINGS: Cardiac shadow is prominent but accentuated by the portable technique. Patchy opacities are noted throughout both lungs which may represent mild congestive failure although multifocal pneumonia deserves consideration as well. No bony abnormality is seen. IMPRESSION: Multifocal opacities likely related to a multifocal pneumonia. Electronically Signed   By: Alcide Clever M.D.   On: 02/13/2019 21:52    DISCHARGE EXAMINATION: Vitals:   02/17/19 1600 02/17/19 1601 02/17/19 1940 02/18/19 0520  BP: 113/81  115/84 99/66  Pulse: (!) 55  (!) 57 (!) 42  Resp:  20    Temp:  (!) 97.5 F (36.4 C) 98 F (36.7 C) 97.6 F (36.4 C)  TempSrc:  Oral Oral Oral  SpO2: 97%  (!) 88% 96%  Weight:      Height:       General appearance: Awake alert.  In no distress Resp: Clear to auscultation bilaterally.  Normal effort Cardio: S1-S2 is normal regular.  No S3-S4.  No rubs murmurs or bruit GI: Abdomen is soft.  Nontender nondistended.  Bowel sounds are present normal.  No masses organomegaly Extremities: No edema.  Full range of motion of lower extremities. Neurologic: Alert and oriented x3.  No focal neurological deficits.    DISPOSITION: Home with home oxygen  Discharge Instructions    Call MD for:  difficulty breathing, headache or visual disturbances   Complete by: As directed    Call MD for:  extreme fatigue   Complete by: As directed    Call MD for:  persistant dizziness or light-headedness   Complete by: As directed    Call MD for:  persistant nausea and vomiting   Complete by: As directed    Call MD for:  severe uncontrolled pain   Complete by: As directed    Call MD for:  temperature >100.4   Complete by: As directed    Discharge instructions   Complete by: As directed    Please take your medications as prescribed.    COVID 19 INSTRUCTIONS  - You are felt to be stable enough to no longer require inpatient  monitoring, testing, and treatment, though you will need to follow the recommendations below: - Based on the CDC's non-test criteria for ending self-isolation: You may not return to work/leave the home until at least 21 days since symptom onset AND 3 days without a fever (without taking tylenol, ibuprofen, etc.) AND have improvement in respiratory symptoms. - Do not take NSAID medications (  including, but not limited to, ibuprofen, advil, motrin, naproxen, aleve, goody's powder, etc.) - Follow up with your doctor in the next week via telehealth or seek medical attention right away if your symptoms get WORSE.  - Consider donating plasma after you have recovered (either 14 days after a negative test or 28 days after symptoms have completely resolved) because your antibodies to this virus may be helpful to give to others with life-threatening infections. Please go to the website [email or web address] if you would like to consider volunteering for plasma donation.    Directions for you at home:  Wear a facemask You should wear a facemask that covers your nose and mouth when you are in the same room with other people and when you visit a healthcare provider. People who live with or visit you should also wear a facemask while they are in the same room with you.  Separate yourself from other people in your home As much as possible, you should stay in a different room from other people in your home. Also, you should use a separate bathroom, if available.  Avoid sharing household items You should not share dishes, drinking glasses, cups, eating utensils, towels, bedding, or other items with other people in your home. After using these items, you should wash them thoroughly with soap and water.  Cover your coughs and sneezes Cover your mouth and nose with a tissue when you cough or sneeze, or you can cough or sneeze into your sleeve. Throw used tissues in a lined trash can, and immediately wash your hands  with soap and water for at least 20 seconds or use an alcohol-based hand rub.  Wash your [facility] your hands often and thoroughly with soap and water for at least 20 seconds. You can use an alcohol-based hand sanitizer if soap and water are not available and if your hands are not visibly dirty. Avoid touching your eyes, nose, and mouth with unwashed hands.  Directions for those who live with, or provide care at home for you:  Limit the number of people who have contact with the patient If possible, have only one caregiver for the patient. Other household members should stay in another home or place of residence. If this is not possible, they should stay in another room, or be separated from the patient as much as possible. Use a separate bathroom, if available. Restrict visitors who do not have an essential need to be in the home.  Ensure good ventilation Make sure that shared spaces in the home have good air flow, such as from an air conditioner or an opened window, weather permitting.  Wash your hands often Wash your hands often and thoroughly with soap and water for at least 20 seconds. You can use an alcohol based hand sanitizer if soap and water are not available and if your hands are not visibly dirty. Avoid touching your eyes, nose, and mouth with unwashed hands. Use disposable paper towels to dry your hands. If not available, use dedicated cloth towels and replace them when they become wet.  Wear a facemask and gloves Wear a disposable facemask at all times in the room and gloves when you touch or have contact with the patients blood, body fluids, and/or secretions or excretions, such as sweat, saliva, sputum, nasal mucus, vomit, urine, or feces.  Ensure the mask fits over your nose and mouth tightly, and do not touch it during use. Throw out disposable facemasks and gloves after using them. Do not  reuse. Wash your hands immediately after removing your facemask and gloves. If  your personal clothing becomes contaminated, carefully remove clothing and launder. Wash your hands after handling contaminated clothing. Place all used disposable facemasks, gloves, and other waste in a lined container before disposing them with other household waste. Remove gloves and wash your hands immediately after handling these items.  Do not share dishes, glasses, or other household items with the patient Avoid sharing household items. You should not share dishes, drinking glasses, cups, eating utensils, towels, bedding, or other items with a patient who is confirmed to have, or being evaluated for, COVID-19 infection. After the person uses these items, you should wash them thoroughly with soap and water.  Wash laundry thoroughly Immediately remove and wash clothes or bedding that have blood, body fluids, and/or secretions or excretions, such as sweat, saliva, sputum, nasal mucus, vomit, urine, or feces, on them. Wear gloves when handling laundry from the patient. Read and follow directions on labels of laundry or clothing items and detergent. In general, wash and dry with the warmest temperatures recommended on the label.  Clean all areas the individual has used often Clean all touchable surfaces, such as counters, tabletops, doorknobs, bathroom fixtures, toilets, phones, keyboards, tablets, and bedside tables, every day. Also, clean any surfaces that may have blood, body fluids, and/or secretions or excretions on them. Wear gloves when cleaning surfaces the patient has come in contact with. Use a diluted bleach solution (e.g., dilute bleach with 1 part bleach and 10 parts water) or a household disinfectant with a label that says EPA-registered for coronaviruses. To make a bleach solution at home, add 1 tablespoon of bleach to 1 quart (4 cups) of water. For a larger supply, add  cup of bleach to 1 gallon (16 cups) of water. Read labels of cleaning products and follow recommendations  provided on product labels. Labels contain instructions for safe and effective use of the cleaning product including precautions you should take when applying the product, such as wearing gloves or eye protection and making sure you have good ventilation during use of the product. Remove gloves and wash hands immediately after cleaning.  Monitor yourself for signs and symptoms of illness Caregivers and household members are considered close contacts, should monitor their health, and will be asked to limit movement outside of the home to the extent possible. Follow the monitoring steps for close contacts listed on the symptom monitoring form.   If you have additional questions, contact your local health department or call the epidemiologist on call at [phone] (available 24/7). This guidance is subject to change. For the most up-to-date guidance from [facility], please refer to their website: TripMetro.huhttps://www.cdc.gov/coronavirus/2019-ncov/hcp/guidance-prevent-spread.html     You were cared for by a hospitalist during your hospital stay. If you have any questions about your discharge medications or the care you received while you were in the hospital after you are discharged, you can call the unit and asked to speak with the hospitalist on call if the hospitalist that took care of you is not available. Once you are discharged, your primary care physician will handle any further medical issues. Please note that NO REFILLS for any discharge medications will be authorized once you are discharged, as it is imperative that you return to your primary care physician (or establish a relationship with a primary care physician if you do not have one) for your aftercare needs so that they can reassess your need for medications and monitor your lab values. If  you do not have a primary care physician, you can call [phone] for a physician referral.   Increase activity slowly   Complete by: As directed           Allergies as of 02/18/2019   No Known Allergies     Medication List    TAKE these medications   ascorbic acid 500 MG tablet Commonly known as: VITAMIN C Take 1 tablet (500 mg total) by mouth daily for 10 days.   famotidine 20 MG tablet Commonly known as: PEPCID Take 1 tablet (20 mg total) by mouth daily for 10 days.   guaiFENesin-dextromethorphan 100-10 MG/5ML syrup Commonly known as: ROBITUSSIN DM Take 10 mLs by mouth every 4 (four) hours as needed for cough.   predniSONE 20 MG tablet Commonly known as: DELTASONE Take 2 tablets once daily for 5 days, then take 1 tablet once daily for 5 days, then STOP>   zinc sulfate 220 (50 Zn) MG capsule Take 1 capsule (220 mg total) by mouth daily for 10 days.            Durable Medical Equipment  (From admission, onward)         Start     Ordered   02/18/19 0912  For home use only DME oxygen  Once    Question Answer Comment  Length of Need 6 Months   Mode or (Route) Nasal cannula   Liters per Minute 2   Frequency Continuous (stationary and portable oxygen unit needed)   Oxygen conserving device Yes   Oxygen delivery system Gas      02/18/19 0911             TOTAL DISCHARGE TIME: 35 minutes  Thurmond Hildebran [employer]Krishnan  [facility] Pager on [email or web address]  02/18/2019, 10:52 AM

## 2019-02-18 NOTE — Progress Notes (Signed)
Wife attempted to call desk but spoke only [place], this nurse called her back using interpretor [phone] Effie Shy, was able to verify that she has not received any oxygen delivery at this point. Informed her that we would be following up in the morning and be in contact with her. She was very Patent attorney.

## 2019-02-18 NOTE — Discharge Instructions (Signed)
COVID-19 COVID-19 La COVID-19 es una infeccin respiratoria causada por un virus llamado coronavirus tipo 2 causante del sndrome respiratorio agudo grave (SARS-CoV-2). La enfermedad tambin se conoce como enfermedad por coronavirus o nuevo coronavirus. En algunas personas, el virus puede no ocasionar sntomas. En otras, puede producir una infeccin grave. La infeccin puede empeorar rpidamente y causar complicaciones, como:  Neumona o infeccin en los pulmones.  Sndrome de dificultad respiratoria aguda o SDRA. Se trata de la acumulacin de lquido en los pulmones.  Insuficiencia respiratoria aguda. Se trata de una afeccin en la que no pasa suficiente oxgeno de los pulmones al cuerpo.  Sepsis o choque sptico. Se trata de una reaccin grave del cuerpo ante una infeccin.  Problemas de coagulacin.  Infecciones secundarias debido a bacterias u hongos. El virus que causa la COVID-19 es contagioso. Esto significa que puede transmitirse de una persona a otra a travs de las gotitas de saliva de la tos y de los estornudos (secreciones respiratorias). Cules son las causas? Esta enfermedad es causada por un virus. Usted puede contagiarse con este virus:  Al aspirar las gotitas que una persona infectada elimina al toser o estornudar.  Al tocar algo, como una mesa o el picaportes de una puerta, que estuvo expuesto al virus (contaminado) y luego tocarse la boca, nariz o los ojos. Qu incrementa el riesgo? Riesgo de infeccin Es ms probable que se infecte con este virus si:  Vive o viaja a una zona donde hay un brote de COVID-19.  Entra en contacto con una persona enferma que recientemente viaj a una zona con un brote de COVID-19.  Cuida o vive con una persona infectada con COVID-19. Riesgo de enfermedad grave Es ms probable que se enferme gravemente por el virus si:  Tiene 66aos o ms.  Tiene una enfermedad crnica que disminuye la capacidad del cuerpo para combatir las  infecciones (immunocomprometido).  Vive en un hogar de ancianos o centro de atencin a largo plazo.  Tiene una enfermedad prolongada (crnica), como las siguientes: ? Enfermedad pulmonar crnica, que incluye la enfermedad pulmonar obstructiva crnica o asma. ? Enfermedad cardaca. ? Diabetes. ? Enfermedad renal crnica. ? Enfermedad heptica.  Es obeso. Cules son los signos o sntomas? Los sntomas de esta afeccin pueden ser de leves a graves. Los sntomas pueden aparecer en el trmino de 2 a 14 das despus de haber estado expuesto al virus. Incluyen los siguientes:  Fiebre.  Tos.  Dificultad para respirar.  Escalofros.  Dolores musculares.  Dolor de garganta.  Prdida del gusto o el olfato. Algunas personas tambin pueden tener problemas estomacales, como nuseas, vmitos o diarrea. Es posible que otras personas no tengan sntomas de COVID-19. Cmo se diagnostica? Esta afeccin se puede diagnosticar en funcin de lo siguiente:  Sus signos y sntomas, especialmente si: ? Vive en una zona donde hay un brote de COVID-19. ? Viaj recientemente a una zona donde el virus es frecuente. ? Cuida o vive con una persona a quien se le diagnostic COVID-19.  Un examen fsico.  Anlisis de laboratorio que pueden incluir: ? Un hisopado nasal para tomar una muestra de lquido de la nariz. ? Un hisopado de garganta para tomar una muestra de lquido de la garganta. ? Una muestra de mucosidad de los pulmones (esputo). ? Anlisis de sangre.  Los estudios de diagnstico por imgenes pueden incluir radiografas, exploracin por tomografa computarizada (TC) o ecografa. Cmo se trata? En este momento, no hay ningn medicamento para tratar la COVID-19. Los medicamentos para tratar   otras enfermedades se usan a modo de ensayo para comprobar si son eficaces contra la COVID-19. El mdico le informar sobre las maneras de tratar los sntomas. En la mayora de las personas, la infeccin  es leve y puede controlarse en el hogar con reposo, lquidos y medicamentos de venta libre. El tratamiento para una infeccin grave suele realizarse en la unidad de cuidados intensivos (UCI) de un hospital. Puede incluir uno o ms de los siguientes. Estos tratamientos se administran hasta que los sntomas mejoran.  Recibir lquidos y medicamentos a travs de una va intravenosa.  Oxgeno complementario. Para administrar oxgeno extra, se utiliza un tubo en la nariz, una mascarilla o una campana de oxgeno.  Colocarlo para que se recueste boca abajo (decbito prono). Esto facilita el ingreso de oxgeno a los pulmones.  Uso continuo de una mquina de presin positiva de las vas areas (CPAP) o de presin positiva de las vas areas de dos niveles (BPAP). Este tratamiento utiliza una presin de aire leve para mantener las vas respiratorias abiertas. Un tubo conectado a un motor administra oxgeno al cuerpo.  Respirador. Este tratamiento mueve el aire dentro y fuera de los pulmones mediante el uso de un tubo que se coloca en la trquea.  Traqueostoma. En este procedimiento se hace un orificio en el cuello para insertar un tubo de respiracin.  Oxigenacin por membrana extracorprea (OMEC). En este procedimiento, los pulmones tienen la posibilidad de recuperarse al asumir las funciones del corazn y los pulmones. Suministra oxgeno al cuerpo y elimina el dixido de carbono. Siga estas instrucciones en su casa: Estilo de vida  Si est enfermo, qudese en su casa, excepto para obtener atencin mdica. El mdico le indicar cunto tiempo debe quedarse en casa. Llame al mdico antes de buscar atencin mdica.  Haga reposo en su casa como se lo haya indicado el mdico.  No consuma ningn producto que contenga nicotina o tabaco, como cigarrillos, cigarrillos electrnicos y tabaco de mascar. Si necesita ayuda para dejar de fumar, consulte al mdico.  Retome sus actividades normales como se lo haya  indicado el mdico. Pregntele al mdico qu actividades son seguras para usted. Instrucciones generales  Use los medicamentos de venta libre y los recetados solamente como se lo haya indicado el mdico.  Beba suficiente lquido como para mantener la orina de color amarillo plido.  Concurra a todas las visitas de seguimiento como se lo haya indicado el mdico. Esto es importante. Cmo se evita?  No hay ninguna vacuna que ayude a prevenir la infeccin por la COVID-19. Sin embargo, hay medidas que puede tomar para protegerse y proteger a otras personas de este virus. Para protegerse:   No viaje a zonas donde la COVID-19 sea un riesgo. Las zonas donde se informa la presencia de la COVID-19 cambian con frecuencia. Para identificar las zonas de alto riesgo y las restricciones de viaje, consulte el sitio web de viajes de los Centers for Disease Control and Prevention (CDC) (Centros para el Control y la Prevencin de Enfermedades): wwwnc.cdc.gov/travel/notices  Si vive o debe viajar a una zona donde COVID-19 es un riesgo, tome precauciones para evitar infecciones. ? Aljese de las personas enfermas. ? Lvese las manos frecuentemente con agua y jabn durante 20segundos. Use desinfectante para manos con alcohol si no dispone de agua y jabn. ? Evite tocarse la boca, la cara, los ojos o la nariz. ? Evite salir de su casa, siga las indicaciones de su estado y de las autoridades sanitarias locales. ? Si debe   salir de su casa, use un barbijo de tela o una mascarilla facial. ? Desinfecte los objetos y las superficies que se tocan con frecuencia todos los das. Pueden incluir:  Encimeras y mesas.  Picaportes e interruptores de luz.  Lavabos, fregaderos y grifos.  Aparatos electrnicos tales como telfonos, controles remotos, teclados, computadoras y tabletas. Cmo proteger a los dems: Si tiene sntomas de la COVID-19, tome medidas para evitar que el virus se propague a otras personas.  Si cree  que tiene una infeccin por la COVID-19, comunquese de inmediato con su mdico. Informe al equipo de atencin mdica que cree que puede tener una infeccin por la COVID-19.  Qudese en su casa. Salga de su casa solo para buscar atencin mdica. No utilice el transporte pblico.  No viaje mientras est enfermo.  Lvese las manos frecuentemente con agua y jabn durante 20segundos. Usar desinfectante para manos con alcohol si no dispone de agua y jabn.  Mantngase alejado de quienes vivan con usted. Permita que los miembros de la familia sanos cuiden a los nios y las mascotas, si es posible. Si tiene que cuidar a los nios o las mascotas, lvese las manos con frecuencia y use un barbijo. Si es posible, permanezca en su habitacin, separado de los dems. Utilice un bao diferente.  Asegrese de que todas las personas que viven en su casa se laven bien las manos y con frecuencia.  Tosa o estornude en un pauelo de papel o sobre su manga o codo. No tosa o estornude al aire ni se cubra la boca o la nariz con la mano.  Use un barbijo de tela o una mascarilla facial. Dnde buscar ms informacin  Centers for Disease Control and Prevention (Centros para el Control y la Prevencin de Enfermedades): www.cdc.gov/coronavirus/2019-ncov/index.html  World Health Organization (Organizacin Mundial de la Salud): www.who.int/health-topics/coronavirus Comunquese con un mdico si:  Vive o ha viajado a una zona donde la COVID-19 es un riesgo y tiene sntomas de infeccin.  Ha tenido contacto con alguien que tiene COVID-19 y usted tiene sntomas de infeccin. Solicite ayuda de inmediato si:  Tiene dificultad para respirar.  Siente dolor u opresin en el pecho.  Experimenta confusin.  Tiene las uas de los dedos y los labios de color azulado.  Tiene dificultad para despertarse.  Los sntomas empeoran. Estos sntomas pueden representar un problema grave que constituye una emergencia. No espere a  ver si los sntomas desaparecen. Solicite atencin mdica de inmediato. Comunquese con el servicio de emergencias de su localidad (911 en los [place]). No conduzca por sus propios medios hasta el hospital. Informe al personal mdico de emergencias si cree que tiene COVID-19. Resumen  La COVID-19 es una infeccin respiratoria causada por un virus. Tambin se conoce como enfermedad por coronavirus o nuevo coronavirus. Puede causar infecciones graves, como neumona, sndrome de dificultad respiratoria aguda, insuficiencia respiratoria aguda o sepsis.  El virus que causa la COVID-19 es contagioso. Esto significa que puede transmitirse de una persona a otra a travs de las gotitas de saliva de la tos y de los estornudos.  Es ms probable que desarrolle una enfermedad grave si tiene 65 aos o ms, tiene un sistema inmunitario dbil, vive en un hogar de ancianos o tiene enfermedad crnica.  No hay ningn medicamento para tratar la COVID-19. El mdico le informar sobre las maneras de tratar los sntomas.  Tome medidas para protegerse y proteger a los dems contra las infecciones. Lvese las manos con frecuencia y desinfecte los objetos y   las superficies que se tocan con frecuencia todos los das. Mantngase alejado de las personas que estn enfermas y use un barbijo si est enfermo. Esta informacin no tiene como fin reemplazar el consejo del mdico. Asegrese de hacerle al mdico cualquier pregunta que tenga. Document Released: 08/27/2018 Document Revised: 11/29/2018 Document Reviewed: 08/27/2018 Elsevier Patient Education  2020 Elsevier Inc.  

## 2019-02-18 NOTE — Progress Notes (Signed)
SATURATION QUALIFICATIONS: (This note is used to comply with regulatory documentation for home oxygen)  Patient Saturations on Room Air at Rest = 89%  Patient Saturations on Room Air while Ambulating = 86%  Patient Saturations on 2  Liters of oxygen while Ambulating = 92%  Please briefly explain why patient needs home oxygen: covid +

## 2019-02-18 NOTE — Progress Notes (Signed)
Call placed to patients wife, Raiford Noble, to verify whether or not oxygen had been delivered to her home. Call went to voicemail after 8 rings and a message was left with Gray contact info and the reason for the call. No patient information was left on the voicemail, only a question of the oxygen being received. Will follow up in the morning. Patient aware that he is staying at [facility].

## 2019-02-18 NOTE — Progress Notes (Signed)
Pt was to be discharged home with oxygen. O2 tank delivered to room with pulse ox reader as well as thermometer. With the use of tele interpreter, education provided on all equipment delivered to room, verbalized understanding. Teach back demonstrated. Spoke with pt and family with interpreter assistance, instructing family to call pt when O2 concentrator arrives to home and at this point they may come to take pt home. I explained that we cannot allow pt to go home without concentrator in the home due to it being a safety issue. Verbalized understanding. When concentrator had not been delivered to home after several hours, I then attempted to call CM/SW Contact Ninfa Meeker RN at [phone]. Called several times and left voicemail. With no answer or callback, I then contacted [employer] in an effort to resolve matter. Huey Romans stated they were unable to see an order for the O2 concentrator delivery and were unsure as to what happened. After exhausting all options, charge was notified as well as pt. Patient was not angry and very understanding. Will follow up tomorrow during business hours.

## 2019-02-19 LAB — C-REACTIVE PROTEIN: CRP: 2.3 mg/dL — ABNORMAL HIGH (ref <1.0)

## 2019-02-19 LAB — D-DIMER, QUANTITATIVE: D-Dimer, Quant: 0.61 ug/mL-FEU — ABNORMAL HIGH (ref 0.00–0.50)

## 2019-02-19 LAB — FERRITIN: Ferritin: 744 ng/mL — ABNORMAL HIGH (ref 24–336)

## 2019-02-19 NOTE — Care Management (Addendum)
CM contacted [place] liaison regarding home oxygen.  Huey Romans spoke with pts family - family confirms the oxygen was delivered to the home on yesterday - oxygen was placed on pts porch.  Per Huey Romans;  secondary to family being unable to be home on yesterday to receive equipment  - plan was agreed upon with family to leave equipment on porch.   CM was unable to reach family via phone. As a second verification -  Bedside nurse to ask pt to confirm oxygen is in the home with family prior to leaving [place].

## 2019-02-19 NOTE — Progress Notes (Signed)
Patient could not leave yesterday due to issues with oxygen delivery to his house.  Hopefully this will be sorted out today.  Patient did not have any unusual episodes overnight.  He states that he feels well.  His oxygen saturations are in the 90s with oxygen. Examination is unchanged.  Inflammatory markers continue to improve.  He remains stable for discharge home.  Bonnielee Haff 9:41 AM

## 2019-02-19 NOTE — Progress Notes (Signed)
D/C instructions provided to patient via interpreter, denies questions/concerns at this time. Patient transported to front entrance via WC, tol well.

## 2019-02-20 NOTE — Care Management (Signed)
02/20/19 12:24pm  Case manager used [place] interpreter: Ruthell Rummage  [id number], to inform patient of hospital telephonic followup call, which is scheduled for Monday, February 27, 2019 at 3:30pm.  Patient encouraged to be available. Case manager inquired as to how patient was feeling since discharge, he responds that he is a little better, continues to use oxygen. No further concerns.      Ricki Miller, RN BSN Case Manager [phone]

## 2019-02-20 NOTE — Progress Notes (Signed)
Chart reviewed. B Ashtan Girtman RN,MHA,BSN Advanced Care Supervisor 

## 2019-02-27 ENCOUNTER — Other Ambulatory Visit: Payer: Self-pay

## 2019-02-27 ENCOUNTER — Ambulatory Visit (INDEPENDENT_AMBULATORY_CARE_PROVIDER_SITE_OTHER): Payer: HRSA Program | Admitting: Family Medicine

## 2019-02-27 ENCOUNTER — Encounter: Payer: Self-pay | Admitting: Family Medicine

## 2019-02-27 DIAGNOSIS — R05 Cough: Secondary | ICD-10-CM | POA: Diagnosis not present

## 2019-02-27 DIAGNOSIS — Z09 Encounter for follow-up examination after completed treatment for conditions other than malignant neoplasm: Secondary | ICD-10-CM

## 2019-02-27 DIAGNOSIS — U071 COVID-19: Secondary | ICD-10-CM

## 2019-02-27 DIAGNOSIS — J9601 Acute respiratory failure with hypoxia: Secondary | ICD-10-CM

## 2019-02-27 DIAGNOSIS — J1289 Other viral pneumonia: Secondary | ICD-10-CM

## 2019-02-27 DIAGNOSIS — J1282 Pneumonia due to coronavirus disease 2019: Secondary | ICD-10-CM

## 2019-02-27 DIAGNOSIS — R058 Other specified cough: Secondary | ICD-10-CM

## 2019-02-27 MED ORDER — GUAIFENESIN-CODEINE 100-10 MG/5ML PO SYRP: 5.0000 mL | ORAL_SOLUTION | Freq: Three times a day (TID) | ORAL | 0 refills | Status: AC | PRN

## 2019-02-27 NOTE — Progress Notes (Signed)
Patient states that he is still having a cough, fatigue, slight SHOB. Fever has resolved. Appetite has resolved.

## 2019-02-27 NOTE — Progress Notes (Signed)
Virtual Visit via Telephone Note  I connected with Terrance Huynh on 02/27/19 at  3:30 PM EDT by telephone and verified that I am speaking with the correct person using two identifiers.   I discussed the limitations, risks, security and privacy concerns of performing an evaluation and management service by telephone and the availability of in person appointments. I also discussed with the patient that there may be a patient responsible charge related to this service. The patient expressed understanding and agreed to proceed.  Patient Location: Home [facility] Others participating in call: Call initiated by Esmond Plantsara Walker, CMA who also obtained Spanish speaking interpreter [facility] due to a language barrier   History of Present Illness:        43 year old male who is status post hospitalization from 02/13/2019 through 02/18/2019 due to acute hypoxic respiratory failure as a result of COVID-19 associated pneumonia.  Patient was discharged home on oxygen at 2 L which he continues.  Patient does have some increased shortness of breath with activity/ambulation.  Patient states that his main complaint at this time is continued recurrent cough which sometimes interrupts his sleep.  Past Medical History:  Diagnosis Date  . Known health problems: none     Past Surgical History:  Procedure Laterality Date  . NO PAST SURGERIES      Family History  Problem Relation Age of Onset  . Diabetes Mellitus II Neg Hx   . Hypertension Neg Hx     Social History   Tobacco Use  . Smoking status: Never Smoker  . Smokeless tobacco: Never Used  Substance Use Topics  . Alcohol use: Never    Frequency: Never  . Drug use: Never     No Known Allergies     Observations/Objective: No vital signs or physical exam conducted as visit was done via telephone  Assessment and Plan: 1. Acute respiratory failure with hypoxia (HCC); Hospital discharge  follow-up;2. COVID-19 virus infection; 3. pneumonia due to COVID-19; 4.  Recurrent cough Patient's hospital admission/discharge records as well as labs and imaging from recent hospitalization reviewed.  Patient had bilateral pneumonia secondary to COVID-19 as well as acute hypoxic respiratory failure and patient was discharged on 2 L of oxygen by nasal cannula which he continues to use.  He continues to have some shortness of breath with exertion but his main complaint is a recurrent cough which interrupts his sleep.  Prescription will be sent in for Robitussin-AC to take 5 mL's 3 times daily to help with his cough.  Patient has been asked to call next week for continued follow-up and will see if patient is able to decrease oxygen use.  Patient is aware that he should call, return or go to the emergency department if he has any worsening of symptoms in the meantime.  He states that most of the symptoms have resolved other than shortness of breath with exertion and recurrent cough. - guaiFENesin-codeine (ROBITUSSIN AC) 100-10 MG/5ML syrup; Take 5 mLs by mouth 3 (three) times daily as needed for cough.  Dispense: 120 mL; Refill: 0  Follow Up Instructions:Return in about 1 week (around 03/06/2019) for COVID-19 infection.    I discussed the assessment and treatment plan with the patient. The patient was provided an opportunity to ask questions and all were answered. The patient agreed with the plan and demonstrated an understanding of the instructions.   The patient was advised to call back or seek an in-person evaluation if  the symptoms worsen or if the condition fails to improve as anticipated.  I provided 13  minutes of non-face-to-face time during this encounter.   Antony Blackbird, MD

## 2020-05-03 IMAGING — DX PORTABLE CHEST - 1 VIEW
1 series · 1 of 1 positions shown · non-contrast
Comparison: None.

CLINICAL DATA: Fevers and shortness of breath for 2 weeks

EXAM:
PORTABLE CHEST 1 VIEW

[chest ap]
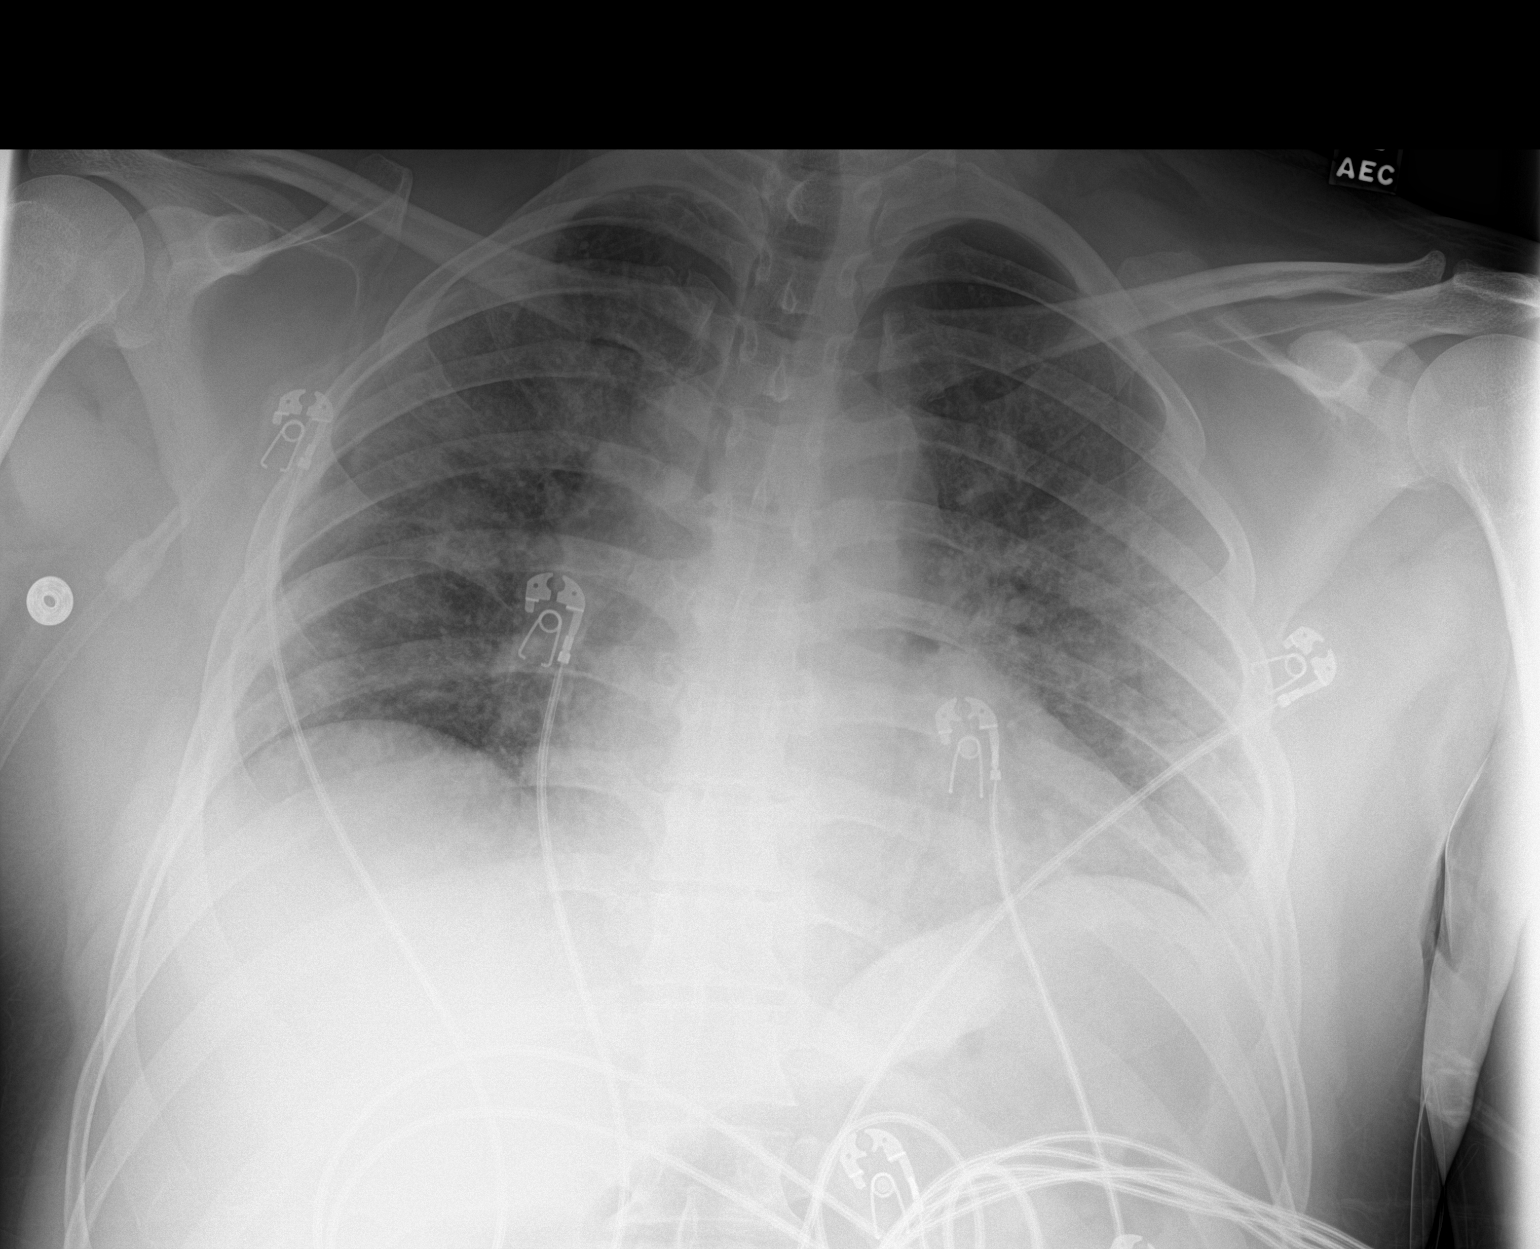

[1 of 1 positions shown; findings below may reference images not displayed]

FINDINGS: Cardiac shadow is prominent but accentuated by the portable
technique. Patchy opacities are noted throughout both lungs which
may represent mild congestive failure although multifocal pneumonia
deserves consideration as well. No bony abnormality is seen.
IMPRESSION: Multifocal opacities likely related to a multifocal pneumonia.
